# Patient Record
Sex: Female | Born: 1990 | Race: Black or African American | Hispanic: No | Marital: Single | Smoking: Current every day smoker
Health system: Southern US, Community
[De-identification: ages and names within clinical notes are randomized; demographics above are authoritative.]

## PROBLEM LIST (undated history)

## (undated) DIAGNOSIS — G8929 Other chronic pain: Secondary | ICD-10-CM

## (undated) DIAGNOSIS — M419 Scoliosis, unspecified: Secondary | ICD-10-CM

## (undated) DIAGNOSIS — K219 Gastro-esophageal reflux disease without esophagitis: Secondary | ICD-10-CM

---
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
## 2010-03-29 LAB — COMPREHENSIVE METABOLIC PANEL
Albumin: 2.9 g/dL — ABNORMAL LOW (ref 3.5–5.2)
Alkaline Phosphatase: 237 U/L — ABNORMAL HIGH (ref 39–117)
BUN: 4 mg/dL — ABNORMAL LOW (ref 6–23)
Calcium: 9.2 mg/dL (ref 8.4–10.5)
Glucose, Bld: 69 mg/dL — ABNORMAL LOW (ref 70–99)
Potassium: 3.6 mEq/L (ref 3.5–5.1)
Sodium: 135 mEq/L (ref 135–145)
Total Protein: 6.3 g/dL (ref 6.0–8.3)

## 2010-03-29 LAB — CBC
HCT: 30.8 % — ABNORMAL LOW (ref 36.0–46.0)
HCT: 30.8 % — ABNORMAL LOW (ref 36.0–46.0)
Hemoglobin: 6.5 g/dL — CL (ref 12.0–15.0)
Hemoglobin: 9.9 g/dL — ABNORMAL LOW (ref 12.0–15.0)
Hemoglobin: 9.9 g/dL — ABNORMAL LOW (ref 12.0–15.0)
MCHC: 32.2 g/dL (ref 30.0–36.0)
MCV: 82.7 fL (ref 78.0–100.0)
Platelets: 137 10*3/uL — ABNORMAL LOW (ref 150–400)
Platelets: 139 10*3/uL — ABNORMAL LOW (ref 150–400)
RBC: 2.36 MIL/uL — ABNORMAL LOW (ref 3.87–5.11)
RDW: 15.3 % (ref 11.5–15.5)
RDW: 15.5 % (ref 11.5–15.5)
WBC: 12.4 10*3/uL — ABNORMAL HIGH (ref 4.0–10.5)

## 2010-03-29 LAB — URINALYSIS, DIPSTICK ONLY
Bilirubin Urine: NEGATIVE
Ketones, ur: 15 mg/dL — AB
Leukocytes, UA: NEGATIVE
Nitrite: NEGATIVE
Protein, ur: 30 mg/dL — AB
pH: 6.5 (ref 5.0–8.0)

## 2010-03-29 LAB — PROTIME-INR
INR: 0.99 (ref 0.00–1.49)
Prothrombin Time: 13 seconds (ref 11.6–15.2)

## 2010-03-29 LAB — HEMOGLOBIN AND HEMATOCRIT, BLOOD
HCT: 22.1 % — ABNORMAL LOW (ref 36.0–46.0)
Hemoglobin: 7 g/dL — ABNORMAL LOW (ref 12.0–15.0)

## 2010-05-22 NOTE — Assessment & Plan Note (Signed)
Natalie, Mitchell                ACCOUNT NO.:  [id number]   MEDICAL RECORD NO.:  [id number]          PATIENT TYPE:  POB   LOCATION:  [facility] at [facility]         FACILITY:  [facility]   PHYSICIAN:  Catalina Antigua, MD     DATE OF BIRTH:  Nov 17, 1990   DATE OF SERVICE:  03/21/2009                                  CLINIC NOTE   This is a 20 year old, G1, P33, who is status post primary C-section on  February 11, 2009, secondary to [facility] at 36 weeks and failed vacuum  extraction.  The patient is currently doing well, is bottle feeding,  reports receiving ample help from her mother with the infant.  Denies  signs or symptoms of postpartum depression.  The patient is bottle  feeding, has not resumed her menses yet, and denies any abnormal  bleeding or discharge at this point.   PHYSICAL EXAMINATION:  VITAL SIGNS:  On physical exam, her blood  pressure is 104/67, pulse of 81, weight of 116 pounds.  BREAST:  Her breasts are symmetric in size, nontender, no expressible  nipple discharge.  No engorgement.  No palpable masses.  ABDOMEN:  Soft, nontender, nondistended.  Her incision has healed very  well.  On bimanual exam, she had normal vaginal mucosa, no abnormal bleeding or  discharge, normal-appearing cervix, small anteverted uterus.  No  palpable adnexal masses or tenderness.   ASSESSMENT AND PLAN:  She is an 20 year old G1, P1 status post primary C-  section on February 11, 2009, who is here for postop check.  The patient  is doing well, interested in Implanon for birth control.  Implanon  information packet was provided.  The patient will return in a week for  Implanon insertion.           ______________________________  Catalina Antigua, MD     PC/MEDQ  D:  03/21/2009  T:  03/22/2009  Job:  [id number]

## 2010-05-22 NOTE — Assessment & Plan Note (Signed)
NAMESEVIN, FARONE                ACCOUNT NO.:  [id number]   MEDICAL RECORD NO.:  [id number]          PATIENT TYPE:  POB   LOCATION:  [facility] at [facility]         FACILITY:  [facility]   PHYSICIAN:  Tinnie Gens, MD        DATE OF BIRTH:  06-Nov-1990   DATE OF SERVICE:  03/28/2009                                  CLINIC NOTE   The patient comes to office today for Implanon insertion.  The patient  is status post C-section on February 5th secondary to [facility] at 36 weeks.  She is doing very well following the birth of her son, he is doing very  well.  She has not resumed sexual intercourse.  She has not resumed her  menstrual cycle.  She does want the Implanon inserted.  She has read the  information and signed the consent.  Her UPT is negative.   LOT NUMBER:  [id number].   EXPIRATION:  November 2012.   PROCEDURE:  The patient's left arm was draped and cleaned with Betadine.  She was marked and injected with 2.5 mL of lidocaine.  The Implanon was  inserted without any difficulty.  The area was cleaned, dried, and  dressed.  The patient is informed that she should use condoms for 1  month.  She will report any signs of infection.  The patient and  practitioner were able to palpate the rod.  The patient will return for  her yearly exam or as needed.      Remonia Richter, NP    ______________________________  Tinnie Gens, MD    LR/MEDQ  D:  03/28/2009  T:  03/29/2009  Job:  [id number]

## 2012-04-16 ENCOUNTER — Emergency Department (HOSPITAL_BASED_OUTPATIENT_CLINIC_OR_DEPARTMENT_OTHER): Payer: Medicaid Other

## 2012-04-16 ENCOUNTER — Emergency Department (HOSPITAL_BASED_OUTPATIENT_CLINIC_OR_DEPARTMENT_OTHER)
Admission: EM | Admit: 2012-04-16 | Discharge: 2012-04-16 | Disposition: A | Discharge status: Home / Self Care | Payer: Medicaid Other | Attending: Emergency Medicine | Admitting: Emergency Medicine

## 2012-04-16 ENCOUNTER — Encounter (HOSPITAL_BASED_OUTPATIENT_CLINIC_OR_DEPARTMENT_OTHER): Payer: Self-pay | Admitting: *Deleted

## 2012-04-16 DIAGNOSIS — F17200 Nicotine dependence, unspecified, uncomplicated: Secondary | ICD-10-CM | POA: Insufficient documentation

## 2012-04-16 DIAGNOSIS — R059 Cough, unspecified: Secondary | ICD-10-CM | POA: Insufficient documentation

## 2012-04-16 DIAGNOSIS — Z3202 Encounter for pregnancy test, result negative: Secondary | ICD-10-CM | POA: Insufficient documentation

## 2012-04-16 DIAGNOSIS — R05 Cough: Secondary | ICD-10-CM | POA: Insufficient documentation

## 2012-04-16 DIAGNOSIS — J3489 Other specified disorders of nose and nasal sinuses: Secondary | ICD-10-CM | POA: Insufficient documentation

## 2012-04-16 DIAGNOSIS — B349 Viral infection, unspecified: Secondary | ICD-10-CM

## 2012-04-16 DIAGNOSIS — B9789 Other viral agents as the cause of diseases classified elsewhere: Secondary | ICD-10-CM | POA: Insufficient documentation

## 2012-04-16 LAB — URINE MICROSCOPIC-ADD ON

## 2012-04-16 LAB — URINALYSIS, ROUTINE W REFLEX MICROSCOPIC
Bilirubin Urine: NEGATIVE
Glucose, UA: NEGATIVE mg/dL
Hgb urine dipstick: NEGATIVE
Ketones, ur: NEGATIVE mg/dL
Protein, ur: NEGATIVE mg/dL
Specific Gravity, Urine: 1.019 (ref 1.005–1.030)
pH: 7 (ref 5.0–8.0)

## 2012-04-16 MED ORDER — BENZONATATE 100 MG PO CAPS: 100.0000 mg | ORAL_CAPSULE | Freq: Three times a day (TID) | ORAL | Status: DC

## 2012-04-16 MED ORDER — ONDANSETRON 4 MG PO TBDP
4.0000 mg | ORAL_TABLET | Freq: Once | ORAL | Status: AC
  Administered 2012-04-16: 4 mg via ORAL
  Filled 2012-04-16: qty 1

## 2012-04-16 MED ORDER — ONDANSETRON HCL 4 MG PO TABS: 4.0000 mg | ORAL_TABLET | Freq: Three times a day (TID) | ORAL | Status: DC | PRN

## 2012-04-16 NOTE — ED Provider Notes (Signed)
History     CSN: [id number]  Arrival date & time 04/16/12  1557   First MD Initiated Contact with Patient 04/16/12 1705      Chief Complaint  Patient presents with  . Emesis    (Consider location/radiation/quality/duration/timing/severity/associated sxs/prior treatment) Patient is a 22 y.o. female presenting with vomiting.  Emesis  Pt reports she has been feeling bad since yesterday with runny nose, dry cough, persistent vomiting and chest soreness. Chest pain is worse with cough. Denies fever Mother recently diagnosed with Bronchitis. She reports last emesis was this AM. Has had sips of juice since then.   History reviewed. No pertinent past medical history.  Past Surgical History  Procedure Laterality Date  . Cesarean section      No family history on file.  History  Substance Use Topics  . Smoking status: Current Every Day Smoker -- 0.50 packs/day    Types: Cigarettes  . Smokeless tobacco: Not on file  . Alcohol Use: Yes    OB History   Grav Para Term Preterm Abortions TAB SAB Ect Mult Living                  Review of Systems  Gastrointestinal: Positive for vomiting.   All other systems reviewed and are negative except as noted in HPI.   Allergies  Review of patient's allergies indicates no known allergies.  Home Medications  No current outpatient prescriptions on file.  BP 126/74  Pulse 88  Temp(Src) 99.1 F (37.3 C) (Oral)  Resp 16  SpO2 100%  Physical Exam  Nursing note and vitals reviewed. Constitutional: She is oriented to person, place, and time. She appears well-developed and well-nourished.  HENT:  Head: Normocephalic and atraumatic.  Eyes: EOM are normal. Pupils are equal, round, and reactive to light.  Neck: Normal range of motion. Neck supple.  Cardiovascular: Normal rate, normal heart sounds and intact distal pulses.   Pulmonary/Chest: Effort normal and breath sounds normal.  Abdominal: Bowel sounds are normal. She exhibits no  distension. There is no tenderness.  Musculoskeletal: Normal range of motion. She exhibits no edema and no tenderness.  Neurological: She is alert and oriented to person, place, and time. She has normal strength. No cranial nerve deficit or sensory deficit.  Skin: Skin is warm and dry. No rash noted.  Psychiatric: She has a normal mood and affect.    ED Course  Procedures (including critical care time)  Labs Reviewed  URINALYSIS, ROUTINE W REFLEX MICROSCOPIC - Abnormal; Notable for the following:    APPearance CLOUDY (*)    Leukocytes, UA MODERATE (*)    All other components within normal limits  URINE MICROSCOPIC-ADD ON - Abnormal; Notable for the following:    Squamous Epithelial / LPF FEW (*)    All other components within normal limits  PREGNANCY, URINE   Dg Chest 2 View  04/16/2012  *RADIOLOGY REPORT*  Clinical Data: Vomiting, cough  CHEST - 2 VIEW  Comparison: None.  Findings: Lungs are clear.  No pleural effusion or pneumothorax.  Cardiomediastinal silhouette is within normal limits.  Visualized osseous structures are within normal limits.  IMPRESSION: Normal chest radiographs.   Original Report Authenticated By: Charline Bills, M.D.      1. Viral syndrome       MDM  UA reviewed. No urinary or vaginal symptoms. Suspect this is a spurious result. CXR clear. Most likely viral URI. Given Zofran and able to tolerate PO in the ED.  Charles B. Bernette Mayers, MD 04/16/12 4098

## 2012-04-16 NOTE — ED Notes (Signed)
Vomiting and cough since yesterday. Fever and chills. Abdominal pain, chest pain with the cough and headache.

## 2012-04-16 NOTE — ED Notes (Signed)
PO fluids provided. 

## 2012-06-23 ENCOUNTER — Emergency Department (HOSPITAL_BASED_OUTPATIENT_CLINIC_OR_DEPARTMENT_OTHER): Payer: Medicaid Other

## 2012-06-23 ENCOUNTER — Emergency Department (HOSPITAL_BASED_OUTPATIENT_CLINIC_OR_DEPARTMENT_OTHER)
Admission: EM | Admit: 2012-06-23 | Discharge: 2012-06-23 | Disposition: A | Discharge status: Home / Self Care | Payer: Medicaid Other | Attending: Emergency Medicine | Admitting: Emergency Medicine

## 2012-06-23 ENCOUNTER — Encounter (HOSPITAL_BASED_OUTPATIENT_CLINIC_OR_DEPARTMENT_OTHER): Payer: Self-pay | Admitting: *Deleted

## 2012-06-23 DIAGNOSIS — Z79899 Other long term (current) drug therapy: Secondary | ICD-10-CM | POA: Insufficient documentation

## 2012-06-23 DIAGNOSIS — F17200 Nicotine dependence, unspecified, uncomplicated: Secondary | ICD-10-CM | POA: Insufficient documentation

## 2012-06-23 DIAGNOSIS — M4120 Other idiopathic scoliosis, site unspecified: Secondary | ICD-10-CM | POA: Insufficient documentation

## 2012-06-23 DIAGNOSIS — R209 Unspecified disturbances of skin sensation: Secondary | ICD-10-CM | POA: Insufficient documentation

## 2012-06-23 DIAGNOSIS — M419 Scoliosis, unspecified: Secondary | ICD-10-CM

## 2012-06-23 HISTORY — DX: Scoliosis, unspecified: M41.9

## 2012-06-23 MED ORDER — HYDROCODONE-ACETAMINOPHEN 5-325 MG PO TABS: 1.0000 | ORAL_TABLET | Freq: Four times a day (QID) | ORAL | Status: DC | PRN

## 2012-06-23 NOTE — ED Notes (Signed)
Low to mid back pain states she has scolosis but has been hurting worse this week denies any urinary symptoms.

## 2012-06-23 NOTE — ED Notes (Signed)
MD at bedside. 

## 2012-06-23 NOTE — ED Provider Notes (Signed)
History     CSN: [id number]  Arrival date & time 06/23/12  1022   First MD Initiated Contact with Patient 06/23/12 1035      Chief Complaint  Patient presents with  . Back Pain    (Consider location/radiation/quality/duration/timing/severity/associated sxs/prior treatment) Patient is a 22 y.o. female presenting with back pain. The history is provided by the patient.  Back Pain Location:  Thoracic spine and lumbar spine Quality:  Aching and stiffness Stiffness is present:  All day Radiates to:  Does not radiate Pain severity:  Moderate Pain is:  Worse during the night Onset quality:  Gradual Timing:  Constant Progression:  Waxing and waning Chronicity:  Chronic Context comment:  States has had back pain for years but tolerable until recently Relieved by:  Nothing Worsened by:  Movement and twisting Ineffective treatments:  Cold packs, heating pad, being still and ibuprofen Associated symptoms: numbness and tingling   Associated symptoms: no abdominal pain, no fever, no leg pain, no paresthesias and no weakness   Associated symptoms comment:  Occasionally with have numbness and tingling in the fingers intermittently Risk factors: no lack of exercise and no recent surgery     Past Medical History  Diagnosis Date  . Scoliosis     Past Surgical History  Procedure Laterality Date  . Cesarean section      History reviewed. No pertinent family history.  History  Substance Use Topics  . Smoking status: Current Every Day Smoker -- 0.50 packs/day    Types: Cigarettes  . Smokeless tobacco: Not on file  . Alcohol Use: Yes    OB History   Grav Para Term Preterm Abortions TAB SAB Ect Mult Living                  Review of Systems  Constitutional: Negative for fever.  Gastrointestinal: Negative for abdominal pain.  Musculoskeletal: Positive for back pain.  Neurological: Positive for tingling and numbness. Negative for weakness and paresthesias.  All other systems  reviewed and are negative.    Allergies  Review of patient's allergies indicates no known allergies.  Home Medications   Current Outpatient Rx  Name  Route  Sig  Dispense  Refill  . benzonatate (TESSALON) 100 MG capsule   Oral   Take 1 capsule (100 mg total) by mouth every 8 (eight) hours.   21 capsule   0   . HYDROcodone-acetaminophen (NORCO/VICODIN) 5-325 MG per tablet   Oral   Take 1 tablet by mouth every 6 (six) hours as needed for pain.   30 tablet   0   . ondansetron (ZOFRAN) 4 MG tablet   Oral   Take 1 tablet (4 mg total) by mouth every 8 (eight) hours as needed for nausea.   12 tablet   0     BP 131/92  Pulse 72  Temp(Src) 98.3 F (36.8 C) (Oral)  Resp 20  Ht 5\' 2"  (1.575 m)  Wt 110 lb (49.896 kg)  BMI 20.11 kg/m2  SpO2 100%  Physical Exam  Nursing note and vitals reviewed. Constitutional: She is oriented to person, place, and time. She appears well-developed and well-nourished. No distress.  HENT:  Head: Normocephalic and atraumatic.  Mouth/Throat: Oropharynx is clear and moist.  Eyes: Conjunctivae and EOM are normal. Pupils are equal, round, and reactive to light.  Neck: Normal range of motion. Neck supple.  Cardiovascular: Normal rate, regular rhythm and intact distal pulses.   No murmur heard. Pulmonary/Chest: Effort normal and breath  sounds normal. No respiratory distress. She has no wheezes. She has no rales.  Abdominal: Soft. She exhibits no distension. There is no tenderness. There is no rebound and no guarding.  Musculoskeletal: Normal range of motion. She exhibits no edema and no tenderness.       Thoracic back: She exhibits tenderness and bony tenderness.       Lumbar back: She exhibits tenderness and bony tenderness. She exhibits no spasm and normal pulse.  Moderate scoliosis through the thoracic and lumbar spine  Neurological: She is alert and oriented to person, place, and time. She has normal strength. No sensory deficit.  Skin: Skin is  warm and dry. No rash noted. No erythema.  Psychiatric: She has a normal mood and affect. Her behavior is normal.    ED Course  Procedures (including critical care time)  Labs Reviewed - No data to display Dg Thoracic Spine W/swimmers  06/23/2012   *RADIOLOGY REPORT*  Clinical Data: Pain  THORACIC SPINE - 2 VIEW + SWIMMERS  Comparison: None.  Findings:  Frontal, lateral, and swimmer's views were obtained. There is mid thoracic levoscoliosis.  There is no fracture or spondylolisthesis.  Disc spaces appear intact.  IMPRESSION: Scoliosis.  No fracture or appreciable arthropathy.   Original Report Authenticated By: Bretta Bang, M.D.   Dg Lumbar Spine Complete  06/23/2012   *RADIOLOGY REPORT*  Clinical Data: Pain  LUMBAR SPINE - COMPLETE 4+ VIEW  Comparison: None.  Findings: Frontal, lateral, spot lumbosacral lateral, and bilateral oblique views were obtained.  There are five non-rib bearing lumbar type vertebral bodies.  There is lumbar dextrorotoscoliosis.  There is no fracture or spondylolisthesis.  There is mild disc space narrowing at L4-5. Other disc spaces appear intact.  There is no appreciable facet arthropathy.  No erosive change.  IMPRESSION: Scoliosis with rotatory component.  Mild disc space narrowing L4-5.  No fracture or spondylolisthesis.  No erosive change.   Original Report Authenticated By: Bretta Bang, M.D.     1. Scoliosis       MDM   Patient here complaining of diffuse back pain without neurologic findings. On exam patient has significant scoliosis. Plain films corroborate scoliosis without any acute findings. Discussed this with the patient and gave her followup and pain control.        Gwyneth Sprout, MD 06/23/12 1539

## 2012-06-23 NOTE — ED Notes (Signed)
Patient transported to X-ray 

## 2014-02-14 ENCOUNTER — Encounter (HOSPITAL_BASED_OUTPATIENT_CLINIC_OR_DEPARTMENT_OTHER): Payer: Self-pay | Admitting: *Deleted

## 2014-02-14 ENCOUNTER — Emergency Department (HOSPITAL_BASED_OUTPATIENT_CLINIC_OR_DEPARTMENT_OTHER)
Admission: EM | Admit: 2014-02-14 | Discharge: 2014-02-14 | Disposition: A | Discharge status: Home / Self Care | Payer: Medicaid Other | Attending: Emergency Medicine | Admitting: Emergency Medicine

## 2014-02-14 DIAGNOSIS — Z79899 Other long term (current) drug therapy: Secondary | ICD-10-CM | POA: Insufficient documentation

## 2014-02-14 DIAGNOSIS — Z720 Tobacco use: Secondary | ICD-10-CM | POA: Insufficient documentation

## 2014-02-14 DIAGNOSIS — M545 Low back pain: Secondary | ICD-10-CM | POA: Diagnosis not present

## 2014-02-14 DIAGNOSIS — M549 Dorsalgia, unspecified: Secondary | ICD-10-CM

## 2014-02-14 DIAGNOSIS — G8929 Other chronic pain: Secondary | ICD-10-CM | POA: Insufficient documentation

## 2014-02-14 MED ORDER — CYCLOBENZAPRINE HCL 10 MG PO TABS: 10.0000 mg | ORAL_TABLET | Freq: Two times a day (BID) | ORAL | Status: DC | PRN

## 2014-02-14 MED ORDER — LIDOCAINE 5 % EX PTCH: 1.0000 | MEDICATED_PATCH | CUTANEOUS | Status: DC

## 2014-02-14 MED ORDER — HYDROCODONE-ACETAMINOPHEN 5-325 MG PO TABS: 1.0000 | ORAL_TABLET | Freq: Four times a day (QID) | ORAL | Status: DC | PRN

## 2014-02-14 NOTE — Progress Notes (Signed)
ED CM [facility] Patient recently received a prescription from PCP for same medication. Prescription cannot be filled.

## 2014-02-14 NOTE — ED Notes (Signed)
D/c home with rx x 3 for flexeril, hydrocodone, and lidoderm patches

## 2014-02-14 NOTE — ED Provider Notes (Signed)
CSN: [id number]     Arrival date & time 02/14/14  1825 History  This chart was scribed for Natalie MochaBlair Kaiulani Sitton, MD by Abel PrestoKara Demonbreun, ED Scribe. This patient was seen in room MH01/MH01 and the patient's care was started at 6:52 PM.    Chief Complaint  Patient presents with  . Back Pain     Patient is a 24 y.o. female presenting with back pain. The history is provided by the patient. No language interpreter was used.  Back Pain Location:  Lumbar spine Quality: throbbing. Radiates to:  Does not radiate Pain severity:  Moderate Pain is:  Worse during the night Onset quality:  Sudden Duration:  2 weeks Timing:  Constant Progression:  Unchanged Chronicity:  New Context: not recent injury   Relieved by:  OTC medications Associated symptoms: no numbness    HPI Comments: Natalie Mitchell is a 24 y.o. female with PMHx of scoliosis who presents to the Emergency Department complaining of throbbing lower back with onset 2 weeks ago. 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 She states pain keeps her up at night. Pt has taken Tylenol for relief. She notes pain is new for her. Pt denies any recent injury and any numbness. Pt currently does not have a PCP.   Past Medical History  Diagnosis Date  . Scoliosis    Past Surgical History  Procedure Laterality Date  . Cesarean section     History reviewed. No pertinent family history. History  Substance Use Topics  . Smoking status: Current Every Day Smoker -- 0.50 packs/day    Types: Cigarettes  . Smokeless tobacco: Not on file  . Alcohol Use: Yes   OB History    No data available     Review of Systems  Musculoskeletal: Positive for back pain.  Neurological: Negative for numbness.  All other systems reviewed and are negative.     Allergies  Review of patient's allergies indicates no known allergies.  Home Medications   Prior to Admission medications   Medication Sig Start Date End Date Taking? Authorizing  Provider  benzonatate (TESSALON) 100 MG capsule Take 1 capsule (100 mg total) by mouth every 8 (eight) hours. 04/16/12   Charles B. Bernette MayersSheldon, MD  HYDROcodone-acetaminophen (NORCO/VICODIN) 5-325 MG per tablet Take 1 tablet by mouth every 6 (six) hours as needed for pain. 06/23/12   Gwyneth SproutWhitney Plunkett, MD  ondansetron (ZOFRAN) 4 MG tablet Take 1 tablet (4 mg total) by mouth every 8 (eight) hours as needed for nausea. 04/16/12   Charles B. Bernette MayersSheldon, MD   BP 146/98 mmHg  Pulse 84  Temp(Src) 98.4 F (36.9 C)  Resp 18  Ht 5\' 4"  (1.626 m)  Wt 106 lb (48.081 kg)  BMI 18.19 kg/m2  SpO2 99% Physical Exam  Constitutional: She is oriented to person, place, and time. She appears well-developed and well-nourished. No distress.  HENT:  Head: Normocephalic and atraumatic.  Eyes: EOM are normal.  Neck: Normal range of motion.  Cardiovascular: Normal rate, regular rhythm and normal heart sounds.   Pulmonary/Chest: Effort normal and breath sounds normal.  Abdominal: Soft. She exhibits no distension. There is no tenderness.  Musculoskeletal: Normal range of motion.       Lumbar back: She exhibits normal range of motion, no tenderness and no bony tenderness.  Neurological: She is alert and oriented to person, place, and time.  Skin: Skin is warm and dry.  Psychiatric: She has a normal mood and affect. Judgment  normal.  Nursing note and vitals reviewed.   ED Course  Procedures (including critical care time) DIAGNOSTIC STUDIES: Oxygen Saturation is 99% on room air, normal by my interpretation.    COORDINATION OF CARE: 6:54 PM Discussed treatment plan with patient at beside, the patient agrees with the plan and has no further questions at this time.   Labs Review Labs Reviewed - No data to display  Imaging Review No results found.   EKG Interpretation None      MDM   Final diagnoses:  None    10F here with back pain, present for 2 weeks. Hx of scoliosis. No trauma. Worse with being on her  fee t all day. No systemic symptoms. Exam benign. No palpable tenderness. Given small amount of pain medicine, lidoderm patches.    I personally performed the services described in this documentation, which was scribed in my presence. The recorded information has been reviewed and is accurate.      Natalie Mocha, MD 02/14/14 [id number]

## 2014-02-14 NOTE — Discharge Instructions (Signed)

## 2014-02-14 NOTE — ED Notes (Signed)
Pt c/o mid-lower back pain x 2 weeks

## 2014-02-14 NOTE — ED Notes (Signed)
MD at bedside. 

## 2014-05-31 ENCOUNTER — Emergency Department (HOSPITAL_BASED_OUTPATIENT_CLINIC_OR_DEPARTMENT_OTHER)
Admission: EM | Admit: 2014-05-31 | Discharge: 2014-05-31 | Disposition: A | Discharge status: Home / Self Care | Payer: Medicaid Other | Attending: Emergency Medicine | Admitting: Emergency Medicine

## 2014-05-31 ENCOUNTER — Encounter (HOSPITAL_BASED_OUTPATIENT_CLINIC_OR_DEPARTMENT_OTHER): Payer: Self-pay | Admitting: *Deleted

## 2014-05-31 DIAGNOSIS — Z8739 Personal history of other diseases of the musculoskeletal system and connective tissue: Secondary | ICD-10-CM | POA: Insufficient documentation

## 2014-05-31 DIAGNOSIS — Z720 Tobacco use: Secondary | ICD-10-CM | POA: Insufficient documentation

## 2014-05-31 DIAGNOSIS — Z79899 Other long term (current) drug therapy: Secondary | ICD-10-CM | POA: Insufficient documentation

## 2014-05-31 DIAGNOSIS — J020 Streptococcal pharyngitis: Secondary | ICD-10-CM | POA: Insufficient documentation

## 2014-05-31 MED ORDER — CEPHALEXIN 500 MG PO CAPS: 500.0000 mg | ORAL_CAPSULE | Freq: Three times a day (TID) | ORAL | Status: DC

## 2014-05-31 NOTE — ED Provider Notes (Signed)
CSN: [id number]     Arrival date & time 05/31/14  1024 History   First MD Initiated Contact with Patient 05/31/14 1048     Chief Complaint  Patient presents with  . Emesis     (Consider location/radiation/quality/duration/timing/severity/associated sxs/prior Treatment) HPI Comments: Patient is a 24 year old female who presents with a 2 day history of fever, sore throat, vomiting. Her son was diagnosed this morning with strep.  Patient is a 24 y.o. female presenting with vomiting. The history is provided by the patient.  Emesis Severity:  Moderate Duration:  2 days Timing:  Constant Progression:  Worsening Chronicity:  New Recent urination:  Normal Relieved by:  Nothing Worsened by:  Nothing tried Associated symptoms: chills and fever   Associated symptoms: no abdominal pain     Past Medical History  Diagnosis Date  . Scoliosis    Past Surgical History  Procedure Laterality Date  . Cesarean section     No family history on file. History  Substance Use Topics  . Smoking status: Current Every Day Smoker -- 0.50 packs/day    Types: Cigarettes  . Smokeless tobacco: Not on file  . Alcohol Use: Yes   OB History    No data available     Review of Systems  Constitutional: Positive for chills.  Gastrointestinal: Positive for vomiting. Negative for abdominal pain.  All other systems reviewed and are negative.     Allergies  Review of patient's allergies indicates no known allergies.  Home Medications   Prior to Admission medications   Medication Sig Start Date End Date Taking? Authorizing Provider  benzonatate (TESSALON) 100 MG capsule Take 1 capsule (100 mg total) by mouth every 8 (eight) hours. 04/16/12   Susy Frizzleharles Sheldon, MD  cyclobenzaprine (FLEXERIL) 10 MG tablet Take 1 tablet (10 mg total) by mouth 2 (two) times daily as needed for muscle spasms. 02/14/14   Elwin MochaBlair Walden, MD  HYDROcodone-acetaminophen (NORCO/VICODIN) 5-325 MG per tablet Take 1 tablet by mouth  every 6 (six) hours as needed for pain. 06/23/12   Gwyneth SproutWhitney Plunkett, MD  HYDROcodone-acetaminophen (NORCO/VICODIN) 5-325 MG per tablet Take 1 tablet by mouth every 6 (six) hours as needed for moderate pain. 02/14/14   Elwin MochaBlair Walden, MD  lidocaine (LIDODERM) 5 % Place 1 patch onto the skin daily. Remove & Discard patch within 12 hours or as directed by MD 02/14/14   Elwin MochaBlair Walden, MD  ondansetron (ZOFRAN) 4 MG tablet Take 1 tablet (4 mg total) by mouth every 8 (eight) hours as needed for nausea. 04/16/12   Susy Frizzleharles Sheldon, MD   BP 142/90 mmHg  Pulse 90  Temp(Src) 98.4 F (36.9 C) (Oral)  Resp 20  Ht 5\' 4"  (1.626 m)  Wt 110 lb (49.896 kg)  BMI 18.87 kg/m2  SpO2 100% Physical Exam  Constitutional: She is oriented to person, place, and time. She appears well-developed and well-nourished. No distress.  HENT:  Head: Normocephalic and atraumatic.  Mouth/Throat: No oropharyngeal exudate.  Posterior oropharynx is erythematous without exudates.  Neck: Normal range of motion. Neck supple.  Cardiovascular: Normal rate and regular rhythm.  Exam reveals no gallop and no friction rub.   No murmur heard. Pulmonary/Chest: Effort normal and breath sounds normal. No respiratory distress. She has no wheezes.  Abdominal: Soft. Bowel sounds are normal. She exhibits no distension. There is no tenderness.  Musculoskeletal: Normal range of motion.  Lymphadenopathy:    She has no cervical adenopathy.  Neurological: She is alert and oriented to person, place, and time.  Skin: Skin is warm and dry. She is not diaphoretic.  Nursing note and vitals reviewed.   ED Course  Procedures (including critical care time) Labs Review Labs Reviewed - No data to display  Imaging Review No results found.   EKG Interpretation None      MDM   Final diagnoses:  None    Due to the close contact with strep throat with similar symptoms, she will be treated with Keflex for presumed strep. To return as needed for any  problems.    Geoffery Lyons, MD 05/31/14 [phone]

## 2014-05-31 NOTE — Discharge Instructions (Signed)
Keflex as prescribed.  You Profen 600 mg every 6 hours as needed for pain or fever.   Strep Throat Strep throat is an infection of the throat caused by a bacteria named Streptococcus pyogenes. Your health care provider may call the infection streptococcal "tonsillitis" or "pharyngitis" depending on whether there are signs of inflammation in the tonsils or back of the throat. Strep throat is most common in children aged 24-15 years during the cold months of the year, but it can occur in people of any age during any season. This infection is spread from person to person (contagious) through coughing, sneezing, or other close contact. SIGNS AND SYMPTOMS   Fever or chills.  Painful, swollen, red tonsils or throat.  Pain or difficulty when swallowing.  White or yellow spots on the tonsils or throat.  Swollen, tender lymph nodes or "glands" of the neck or under the jaw.  Red rash all over the body (rare). DIAGNOSIS  Many different infections can cause the same symptoms. A test must be done to confirm the diagnosis so the right treatment can be given. A "rapid strep test" can help your health care provider make the diagnosis in a few minutes. If this test is not available, a light swab of the infected area can be used for a throat culture test. If a throat culture test is done, results are usually available in a day or two. TREATMENT  Strep throat is treated with antibiotic medicine. HOME CARE INSTRUCTIONS   Gargle with 1 tsp of salt in 1 cup of warm water, 3-4 times per day or as needed for comfort.  Family members who also have a sore throat or fever should be tested for strep throat and treated with antibiotics if they have the strep infection.  Make sure everyone in your household washes their hands well.  Do not share food, drinking cups, or personal items that could cause the infection to spread to others.  You may need to eat a soft food diet until your sore throat gets  better.  Drink enough water and fluids to keep your urine clear or pale yellow. This will help prevent dehydration.  Get plenty of rest.  Stay home from school, day care, or work until you have been on antibiotics for 24 hours.  Take medicines only as directed by your health care provider.  Take your antibiotic medicine as directed by your health care provider. Finish it even if you start to feel better. SEEK MEDICAL CARE IF:   The glands in your neck continue to enlarge.  You develop a rash, cough, or earache.  You cough up green, yellow-brown, or bloody sputum.  You have pain or discomfort not controlled by medicines.  Your problems seem to be getting worse rather than better.  You have a fever. SEEK IMMEDIATE MEDICAL CARE IF:   You develop any new symptoms such as vomiting, severe headache, stiff or painful neck, chest pain, shortness of breath, or trouble swallowing.  You develop severe throat pain, drooling, or changes in your voice.  You develop swelling of the neck, or the skin on the neck becomes red and tender.  You develop signs of dehydration, such as fatigue, dry mouth, and decreased urination.  You become increasingly sleepy, or you cannot wake up completely. MAKE SURE YOU:  Understand these instructions.  Will watch your condition.  Will get help right away if you are not doing well or get worse. Document Released: 12/22/1999 Document Revised: 05/10/2013 Document Reviewed:  02/22/2010 Exit[employer] Patient Information 2015 [place], [place]. This information is not intended to replace advice given to you by your health care provider. Make sure you discuss any questions you have with your health care provider.

## 2014-05-31 NOTE — ED Notes (Signed)
Patient states she has a two day history of a headache and sore throat, which have been associated with chills, nausea and vomiting.  States her son was diagnosed with strep throat today.

## 2014-10-06 ENCOUNTER — Emergency Department (HOSPITAL_BASED_OUTPATIENT_CLINIC_OR_DEPARTMENT_OTHER)
Admission: EM | Admit: 2014-10-06 | Discharge: 2014-10-06 | Disposition: A | Discharge status: Home / Self Care | Payer: Medicaid Other | Attending: Emergency Medicine | Admitting: Emergency Medicine

## 2014-10-06 ENCOUNTER — Encounter (HOSPITAL_BASED_OUTPATIENT_CLINIC_OR_DEPARTMENT_OTHER): Payer: Self-pay | Admitting: Emergency Medicine

## 2014-10-06 ENCOUNTER — Emergency Department (HOSPITAL_BASED_OUTPATIENT_CLINIC_OR_DEPARTMENT_OTHER): Payer: Medicaid Other

## 2014-10-06 DIAGNOSIS — Z792 Long term (current) use of antibiotics: Secondary | ICD-10-CM | POA: Insufficient documentation

## 2014-10-06 DIAGNOSIS — G8929 Other chronic pain: Secondary | ICD-10-CM

## 2014-10-06 DIAGNOSIS — M79674 Pain in right toe(s): Secondary | ICD-10-CM | POA: Insufficient documentation

## 2014-10-06 DIAGNOSIS — Z79899 Other long term (current) drug therapy: Secondary | ICD-10-CM | POA: Insufficient documentation

## 2014-10-06 DIAGNOSIS — Z720 Tobacco use: Secondary | ICD-10-CM | POA: Insufficient documentation

## 2014-10-06 DIAGNOSIS — M419 Scoliosis, unspecified: Secondary | ICD-10-CM | POA: Insufficient documentation

## 2014-10-06 MED ORDER — NAPROXEN 500 MG PO TABS: 500.0000 mg | ORAL_TABLET | Freq: Two times a day (BID) | ORAL | Status: DC

## 2014-10-06 NOTE — ED Notes (Signed)
Family at bedside. 

## 2014-10-06 NOTE — Discharge Instructions (Signed)
Would recommend soaking the the right foot in warm water for 20 minutes and then trimming back the second toenail. Take the Naprosyn on a regular basis. Make an appointment to follow-up with podiatry if it does not improve. Work note provided.

## 2014-10-06 NOTE — ED Provider Notes (Signed)
CSN: [id number]     Arrival date & time 10/06/14  0930 History   First MD Initiated Contact with Patient 10/06/14 1000     Chief Complaint  Patient presents with  . Toe Pain     (Consider location/radiation/quality/duration/timing/severity/associated sxs/prior Treatment) Patient is a 24 y.o. female presenting with toe pain. The history is provided by the patient.  Toe Pain Pertinent negatives include no chest pain, no abdominal pain, no headaches and no shortness of breath.   patient with complaint of right second toe pain for 3 months. Patient thinks she has an ingrown toenail. She has not trimmed that toenail because it's been painful. No direct injury to the foot.  Past Medical History  Diagnosis Date  . Scoliosis   . Scoliosis    Past Surgical History  Procedure Laterality Date  . Cesarean section     History reviewed. No pertinent family history. Social History  Substance Use Topics  . Smoking status: Current Every Day Smoker -- 0.50 packs/day    Types: Cigarettes  . Smokeless tobacco: None  . Alcohol Use: Yes   OB History    No data available     Review of Systems  Constitutional: Negative for fever.  HENT: Negative for congestion.   Eyes: Negative for redness.  Respiratory: Negative for shortness of breath.   Cardiovascular: Negative for chest pain.  Gastrointestinal: Negative for abdominal pain.  Genitourinary: Negative for dysuria.  Musculoskeletal: Negative for back pain.  Skin: Negative for rash and wound.  Neurological: Negative for weakness, numbness and headaches.  Hematological: Does not bruise/bleed easily.  Psychiatric/Behavioral: Negative for confusion.      Allergies  Review of patient's allergies indicates no known allergies.  Home Medications   Prior to Admission medications   Medication Sig Start Date End Date Taking? Authorizing Provider  benzonatate (TESSALON) 100 MG capsule Take 1 capsule (100 mg total) by mouth every 8 (eight)  hours. 04/16/12   Susy Frizzle, MD  cephALEXin (KEFLEX) 500 MG capsule Take 1 capsule (500 mg total) by mouth 3 (three) times daily. 05/31/14   Geoffery Lyons, MD  cyclobenzaprine (FLEXERIL) 10 MG tablet Take 1 tablet (10 mg total) by mouth 2 (two) times daily as needed for muscle spasms. 02/14/14   Elwin Mocha, MD  HYDROcodone-acetaminophen (NORCO/VICODIN) 5-325 MG per tablet Take 1 tablet by mouth every 6 (six) hours as needed for pain. 06/23/12   Gwyneth Sprout, MD  HYDROcodone-acetaminophen (NORCO/VICODIN) 5-325 MG per tablet Take 1 tablet by mouth every 6 (six) hours as needed for moderate pain. 02/14/14   Elwin Mocha, MD  lidocaine (LIDODERM) 5 % Place 1 patch onto the skin daily. Remove & Discard patch within 12 hours or as directed by MD 02/14/14   Elwin Mocha, MD  naproxen (NAPROSYN) 500 MG tablet Take 1 tablet (500 mg total) by mouth 2 (two) times daily. 10/06/14   Vanetta Mulders, MD  ondansetron (ZOFRAN) 4 MG tablet Take 1 tablet (4 mg total) by mouth every 8 (eight) hours as needed for nausea. 04/16/12   Susy Frizzle, MD   BP 154/94 mmHg  Pulse 60  Temp(Src) 97.7 F (36.5 C) (Oral)  Resp 18  Ht  (1.702 m)  Wt 108 lb (48.988 kg)  BMI 16.91 kg/m2  SpO2 100% Physical Exam  Constitutional: She is oriented to person, place, and time. She appears well-developed and well-nourished. No distress.  HENT:  Head: Normocephalic and atraumatic.  Eyes: Conjunctivae and EOM are normal. Pupils are equal, round,  and reactive to light.  Neck: Normal range of motion.  Cardiovascular: Normal rate, regular rhythm and normal heart sounds.   No murmur heard. Pulmonary/Chest: Effort normal and breath sounds normal. No respiratory distress.  Abdominal: Soft. Bowel sounds are normal. There is no tenderness.  Musculoskeletal: Normal range of motion.  Right foot second toe the nail is grown extensively and curling in over the tip of the soft tissue aspect of the second toe. No real erythema. The nail  itself is a bit thickened. No purulent discharge. Looking under the nail as it wraps around the skin appears to be normal.  Neurological: She is alert and oriented to person, place, and time. No cranial nerve deficit. She exhibits normal muscle tone. Coordination normal.  Skin: Skin is warm. No erythema.  Nursing note and vitals reviewed.   ED Course  Procedures (including critical care time) Labs Review Labs Reviewed - No data to display  Imaging Review Dg Toe 2nd Right  10/06/2014   CLINICAL DATA:  Right second toe pain for 3 months. No known injury. Initial encounter.  EXAM: RIGHT SECOND TOE  COMPARISON:  None.  FINDINGS: No acute bony or joint abnormality is identified. No radiopaque foreign body or soft tissue gas collection is seen. Small defect in the skin at the tip of the toe may be due to an ulceration.  IMPRESSION: Question skin ulceration the tip of the second toe. The examination is otherwise negative.   Electronically Signed   By: Drusilla Kanner M.D.   On: 10/06/2014 10:50   I have personally reviewed and evaluated these images and lab results as part of my medical decision-making.   EKG Interpretation None      MDM   Final diagnoses:  Toe pain, chronic, right    Patient with complaint of right second toe pain at the tip of the toe and nail area for 3 months. The particular nail has not been trimmed for a long period of time patient stated because of pain. Denies any purulent discharge at any point in time. Does not seem to clinically be consistent with an ingrown toenail at this time certainly not acutely inflamed. Suspect the pain now may be due to the nail pushing into the pulp of the second toe. Have recommended soaking and then trimming it back. And following up with podiatry and anti-inflammatory medicine. X-ray shows no bony injury.    Vanetta Mulders, MD 10/06/14 [phone]

## 2014-10-06 NOTE — ED Notes (Signed)
MD at bedside. 

## 2014-10-06 NOTE — ED Notes (Signed)
Patient transported to X-ray 

## 2014-10-06 NOTE — ED Notes (Addendum)
Pt with right toe pain, reports ingrown toe nail to second right toenail x 3 months

## 2014-12-08 ENCOUNTER — Emergency Department (HOSPITAL_BASED_OUTPATIENT_CLINIC_OR_DEPARTMENT_OTHER)
Admission: EM | Admit: 2014-12-08 | Discharge: 2014-12-08 | Disposition: A | Discharge status: Home / Self Care | Payer: Medicaid Other | Attending: Emergency Medicine | Admitting: Emergency Medicine

## 2014-12-08 ENCOUNTER — Encounter (HOSPITAL_BASED_OUTPATIENT_CLINIC_OR_DEPARTMENT_OTHER): Payer: Self-pay

## 2014-12-08 ENCOUNTER — Emergency Department (HOSPITAL_BASED_OUTPATIENT_CLINIC_OR_DEPARTMENT_OTHER): Payer: Medicaid Other

## 2014-12-08 DIAGNOSIS — Z792 Long term (current) use of antibiotics: Secondary | ICD-10-CM | POA: Insufficient documentation

## 2014-12-08 DIAGNOSIS — F17210 Nicotine dependence, cigarettes, uncomplicated: Secondary | ICD-10-CM | POA: Insufficient documentation

## 2014-12-08 DIAGNOSIS — J069 Acute upper respiratory infection, unspecified: Secondary | ICD-10-CM | POA: Insufficient documentation

## 2014-12-08 DIAGNOSIS — R112 Nausea with vomiting, unspecified: Secondary | ICD-10-CM | POA: Insufficient documentation

## 2014-12-08 DIAGNOSIS — Z8739 Personal history of other diseases of the musculoskeletal system and connective tissue: Secondary | ICD-10-CM | POA: Insufficient documentation

## 2014-12-08 DIAGNOSIS — Z791 Long term (current) use of non-steroidal anti-inflammatories (NSAID): Secondary | ICD-10-CM | POA: Insufficient documentation

## 2014-12-08 MED ORDER — PROMETHAZINE-DM 6.25-15 MG/5ML PO SYRP: 5.0000 mL | ORAL_SOLUTION | Freq: Four times a day (QID) | ORAL | Status: DC | PRN

## 2014-12-08 NOTE — ED Provider Notes (Addendum)
CSN: [id number]     Arrival date & time 12/08/14  1200 History   First MD Initiated Contact with Patient 12/08/14 1238     Chief Complaint  Patient presents with  . Chest Pain     (Consider location/radiation/quality/duration/timing/severity/associated sxs/prior Treatment) Patient is a 24 y.o. female presenting with chest pain and URI. The history is provided by the patient.  Chest Pain Pain location:  Substernal area Pain quality: aching   Pain radiates to:  Does not radiate Duration:  1 day Timing:  Constant Progression:  Worsening Context: breathing   Ineffective treatments:  None tried Associated symptoms: cough, nausea and vomiting   Associated symptoms: no abdominal pain, no fever and no shortness of breath   Associated symptoms comment:  Chills Risk factors: smoking   Risk factors: no coronary artery disease and no diabetes mellitus   URI Presenting symptoms: cough   Presenting symptoms: no fever   Severity:  Moderate Onset quality:  Gradual Duration:  1 day Timing:  Constant Progression:  Worsening Chronicity:  New Associated symptoms: no neck pain and no wheezing   Risk factors: sick contacts     Past Medical History  Diagnosis Date  . Scoliosis   . Scoliosis    Past Surgical History  Procedure Laterality Date  . Cesarean section     History reviewed. No pertinent family history. Social History  Substance Use Topics  . Smoking status: Current Every Day Smoker -- 0.50 packs/day    Types: Cigarettes  . Smokeless tobacco: None  . Alcohol Use: Yes   OB History    No data available     Review of Systems  Constitutional: Negative for fever.  Respiratory: Positive for cough. Negative for shortness of breath and wheezing.   Cardiovascular: Positive for chest pain.  Gastrointestinal: Positive for nausea and vomiting. Negative for abdominal pain.  Musculoskeletal: Negative for neck pain.  All other systems reviewed and are negative.     Allergies   Review of patient's allergies indicates no known allergies.  Home Medications   Prior to Admission medications   Medication Sig Start Date End Date Taking? Authorizing Provider  benzonatate (TESSALON) 100 MG capsule Take 1 capsule (100 mg total) by mouth every 8 (eight) hours. 04/16/12   Susy Frizzleharles Sheldon, MD  cephALEXin (KEFLEX) 500 MG capsule Take 1 capsule (500 mg total) by mouth 3 (three) times daily. 05/31/14   Geoffery Lyonsouglas Delo, MD  cyclobenzaprine (FLEXERIL) 10 MG tablet Take 1 tablet (10 mg total) by mouth 2 (two) times daily as needed for muscle spasms. 02/14/14   Elwin MochaBlair Walden, MD  HYDROcodone-acetaminophen (NORCO/VICODIN) 5-325 MG per tablet Take 1 tablet by mouth every 6 (six) hours as needed for pain. 06/23/12   Gwyneth SproutWhitney Priest Lockridge, MD  HYDROcodone-acetaminophen (NORCO/VICODIN) 5-325 MG per tablet Take 1 tablet by mouth every 6 (six) hours as needed for moderate pain. 02/14/14   Elwin MochaBlair Walden, MD  lidocaine (LIDODERM) 5 % Place 1 patch onto the skin daily. Remove & Discard patch within 12 hours or as directed by MD 02/14/14   Elwin MochaBlair Walden, MD  naproxen (NAPROSYN) 500 MG tablet Take 1 tablet (500 mg total) by mouth 2 (two) times daily. 10/06/14   Vanetta MuldersScott Zackowski, MD  ondansetron (ZOFRAN) 4 MG tablet Take 1 tablet (4 mg total) by mouth every 8 (eight) hours as needed for nausea. 04/16/12   Susy Frizzleharles Sheldon, MD   BP 131/108 mmHg  Pulse 93  Temp(Src) 98.7 F (37.1 C) (Oral)  Resp 15  Ht   (1.6 m)  Wt 110 lb (49.896 kg)  BMI 19.49 kg/m2  SpO2 99% Physical Exam  Constitutional: She is oriented to person, place, and time. She appears well-developed and well-nourished. No distress.  HENT:  Head: Normocephalic and atraumatic.  Right Ear: Tympanic membrane and ear canal normal.  Left Ear: Tympanic membrane and ear canal normal.  Nose: Mucosal edema present.  Mouth/Throat: Oropharynx is clear and moist. No oropharyngeal exudate, posterior oropharyngeal edema or posterior oropharyngeal erythema.   Eyes: Conjunctivae and EOM are normal. Pupils are equal, round, and reactive to light.  Neck: Normal range of motion. Neck supple.  Cardiovascular: Normal rate, regular rhythm and intact distal pulses.   No murmur heard. Pulmonary/Chest: Effort normal and breath sounds normal. No respiratory distress. She has no wheezes. She has no rales.  Abdominal: Soft. She exhibits no distension. There is no tenderness. There is no rebound and no guarding.  Musculoskeletal: Normal range of motion. She exhibits no edema or tenderness.  Lymphadenopathy:    She has no cervical adenopathy.  Neurological: She is alert and oriented to person, place, and time.  Skin: Skin is warm and dry. No rash noted. No erythema.  Psychiatric: She has a normal mood and affect. Her behavior is normal.  Nursing note and vitals reviewed.   ED Course  Procedures (including critical care time) Labs Review Labs Reviewed - No data to display  Imaging Review No results found. I have personally reviewed and evaluated these images and lab results as part of my medical decision-making.   EKG Interpretation   Date/Time:  Thursday December 08 2014 12:25:55 EST Ventricular Rate:  74 PR Interval:  202 QRS Duration: 84 QT Interval:  374 QTC Calculation: 415 R Axis:   82 Text Interpretation:  Normal sinus rhythm Normal ECG No previous tracing  Confirmed by Anitra Lauth  MD, Alphonzo Lemmings ([id number]) on 12/08/2014 12:35:42 PM      MDM   Final diagnoses:  URI, acute    Pt with symptoms consistent with viral URI.  Well appearing here.  No signs of breathing difficulty  No signs of pharyngitis, otitis or abnormal abdominal findings.    pt to return with any further problems.     Gwyneth Sprout, MD 12/08/14 1251  Gwyneth Sprout, MD 12/08/14 1252

## 2014-12-08 NOTE — Discharge Instructions (Signed)
Upper Respiratory Infection, Adult Most upper respiratory infections (URIs) are a viral infection of the air passages leading to the lungs. A URI affects the nose, throat, and upper air passages. The most common type of URI is nasopharyngitis and is typically referred to as "the common cold." URIs run their course and usually go away on their own. Most of the time, a URI does not require medical attention, but sometimes a bacterial infection in the upper airways can follow a viral infection. This is called a secondary infection. Sinus and middle ear infections are common types of secondary upper respiratory infections. Bacterial pneumonia can also complicate a URI. A URI can worsen asthma and chronic obstructive pulmonary disease (COPD). Sometimes, these complications can require emergency medical care and may be life threatening.  CAUSES Almost all URIs are caused by viruses. A virus is a type of germ and can spread from one person to another.  RISKS FACTORS You may be at risk for a URI if:   You smoke.   You have chronic heart or lung disease.  You have a weakened defense (immune) system.   You are very young or very old.   You have nasal allergies or asthma.  You work in crowded or poorly ventilated areas.  You work in health care facilities or schools. SIGNS AND SYMPTOMS  Symptoms typically develop 2-3 days after you come in contact with a cold virus. Most viral URIs last 7-10 days. However, viral URIs from the influenza virus (flu virus) can last 14-18 days and are typically more severe. Symptoms may include:   Runny or stuffy (congested) nose.   Sneezing.   Cough.   Sore throat.   Headache.   Fatigue.   Fever.   Loss of appetite.   Pain in your forehead, behind your eyes, and over your cheekbones (sinus pain).  Muscle aches.  DIAGNOSIS  Your health care provider may diagnose a URI by:  Physical exam.  Tests to check that your symptoms are not due to  another condition such as:  Strep throat.  Sinusitis.  Pneumonia.  Asthma. TREATMENT  A URI goes away on its own with time. It cannot be cured with medicines, but medicines may be prescribed or recommended to relieve symptoms. Medicines may help:  Reduce your fever.  Reduce your cough.  Relieve nasal congestion. HOME CARE INSTRUCTIONS   Take medicines only as directed by your health care provider.   Gargle warm saltwater or take cough drops to comfort your throat as directed by your health care provider.  Use a warm mist humidifier or inhale steam from a shower to increase air moisture. This may make it easier to breathe.  Drink enough fluid to keep your urine clear or pale yellow.   Eat soups and other clear broths and maintain good nutrition.   Rest as needed.   Return to work when your temperature has returned to normal or as your health care provider advises. You may need to stay home longer to avoid infecting others. You can also use a face mask and careful hand washing to prevent spread of the virus.  Increase the usage of your inhaler if you have asthma.   Do not use any tobacco products, including cigarettes, chewing tobacco, or electronic cigarettes. If you need help quitting, ask your health care provider. PREVENTION  The best way to protect yourself from getting a cold is to practice good hygiene.   Avoid oral or hand contact with people with cold   symptoms.   Wash your hands often if contact occurs.  There is no clear evidence that vitamin C, vitamin E, echinacea, or exercise reduces the chance of developing a cold. However, it is always recommended to get plenty of rest, exercise, and practice good nutrition.  SEEK MEDICAL CARE IF:   You are getting worse rather than better.   Your symptoms are not controlled by medicine.   You have chills.  You have worsening shortness of breath.  You have brown or red mucus.  You have yellow or brown nasal  discharge.  You have pain in your face, especially when you bend forward.  You have a fever.  You have swollen neck glands.  You have pain while swallowing.  You have white areas in the back of your throat. SEEK IMMEDIATE MEDICAL CARE IF:   You have severe or persistent:  Headache.  Ear pain.  Sinus pain.  Chest pain.  You have chronic lung disease and any of the following:  Wheezing.  Prolonged cough.  Coughing up blood.  A change in your usual mucus.  You have a stiff neck.  You have changes in your:  Vision.  Hearing.  Thinking.  Mood. MAKE SURE YOU:   Understand these instructions.  Will watch your condition.  Will get help right away if you are not doing well or get worse.   This information is not intended to replace advice given to you by your health care provider. Make sure you discuss any questions you have with your health care provider.   Document Released: 06/19/2000 Document Revised: 05/10/2014 Document Reviewed: 03/31/2013 Elsevier Interactive Patient Education 2016 [employer]  

## 2014-12-08 NOTE — ED Notes (Signed)
Pt reports chest pain, prod cough, vomiting, headache, intermittent since Monday. Pt states she is concerned she may have the flu.

## 2016-01-09 ENCOUNTER — Emergency Department (HOSPITAL_BASED_OUTPATIENT_CLINIC_OR_DEPARTMENT_OTHER)
Admission: EM | Admit: 2016-01-09 | Discharge: 2016-01-09 | Disposition: A | Discharge status: Home / Self Care | Payer: Medicaid Other | Attending: Physician Assistant | Admitting: Physician Assistant

## 2016-01-09 ENCOUNTER — Encounter (HOSPITAL_BASED_OUTPATIENT_CLINIC_OR_DEPARTMENT_OTHER): Payer: Self-pay | Admitting: *Deleted

## 2016-01-09 DIAGNOSIS — B9789 Other viral agents as the cause of diseases classified elsewhere: Secondary | ICD-10-CM

## 2016-01-09 DIAGNOSIS — J069 Acute upper respiratory infection, unspecified: Secondary | ICD-10-CM | POA: Insufficient documentation

## 2016-01-09 DIAGNOSIS — F17210 Nicotine dependence, cigarettes, uncomplicated: Secondary | ICD-10-CM | POA: Insufficient documentation

## 2016-01-09 MED ORDER — BENZONATATE 100 MG PO CAPS: 100.0000 mg | ORAL_CAPSULE | Freq: Three times a day (TID) | ORAL | 0 refills | Status: DC

## 2016-01-09 MED ORDER — PROMETHAZINE-DM 6.25-15 MG/5ML PO SYRP: 5.0000 mL | ORAL_SOLUTION | Freq: Four times a day (QID) | ORAL | 0 refills | Status: DC | PRN

## 2016-01-09 NOTE — ED Notes (Signed)
Pt asking how long wait time is. Instructed pt we would see her as soon as possible. Pt c/o chest hurting and states she wants her VS checked so she can leave.

## 2016-01-09 NOTE — ED Notes (Signed)
Pt repeatedly asking wait time.

## 2016-01-09 NOTE — ED Notes (Signed)
Pt talking loudly on her cell phone about how staff does not care about her welfare and that other people are going ahead of her. Pt comes to nurse first desk talking loudly about how her chest hurts. THis RN took pt to triage room and rechecked VS and triage RN roomed pt while VS were being taken to fast track.

## 2016-01-09 NOTE — ED Provider Notes (Signed)
[facility]   CSN: [id number] Arrival date & time: 01/09/16  1318     History   Chief Complaint Chief Complaint  Patient presents with  . Cough    HPI Natalie Mitchell is a 26 y.o. female.  HPI   26 year old female presents with URI symptoms. For the past 2-3 days patient has had subjective fever, chills, runny nose, congestion, sneezing, coughing, and decrease in appetite. She has tried using Mucinex at home with mild relief. She report that her son has similar symptoms a week ago. She works at [employer] and was sent here by her employer for further evaluation. She is a smoker. She denies severe headache, neck stiffness, chest pain, shortness of breath, abdominal pain, nausea vomiting diarrhea, or rash. She did not have flu shot or pneumonia shot this year.  Past Medical History:  Diagnosis Date  . Scoliosis   . Scoliosis     There are no active problems to display for this patient.   Past Surgical History:  Procedure Laterality Date  . CESAREAN SECTION      OB History    No data available       Home Medications    Prior to Admission medications   Medication Sig Start Date End Date Taking? Authorizing Provider  benzonatate (TESSALON) 100 MG capsule Take 1 capsule (100 mg total) by mouth every 8 (eight) hours. 04/16/12   Susy Frizzle, MD  cephALEXin (KEFLEX) 500 MG capsule Take 1 capsule (500 mg total) by mouth 3 (three) times daily. 05/31/14   Geoffery Lyons, MD  cyclobenzaprine (FLEXERIL) 10 MG tablet Take 1 tablet (10 mg total) by mouth 2 (two) times daily as needed for muscle spasms. 02/14/14   Elwin Mocha, MD  HYDROcodone-acetaminophen (NORCO/VICODIN) 5-325 MG per tablet Take 1 tablet by mouth every 6 (six) hours as needed for pain. 06/23/12   Gwyneth Sprout, MD  HYDROcodone-acetaminophen (NORCO/VICODIN) 5-325 MG per tablet Take 1 tablet by mouth every 6 (six) hours as needed for moderate pain. 02/14/14   Elwin Mocha, MD  lidocaine (LIDODERM) 5  % Place 1 patch onto the skin daily. Remove & Discard patch within 12 hours or as directed by MD 02/14/14   Elwin Mocha, MD  naproxen (NAPROSYN) 500 MG tablet Take 1 tablet (500 mg total) by mouth 2 (two) times daily. 10/06/14   Vanetta Mulders, MD  ondansetron (ZOFRAN) 4 MG tablet Take 1 tablet (4 mg total) by mouth every 8 (eight) hours as needed for nausea. 04/16/12   Susy Frizzle, MD  promethazine-dextromethorphan (PROMETHAZINE-DM) 6.25-15 MG/5ML syrup Take 5 mLs by mouth 4 (four) times daily as needed for cough. 12/08/14   Gwyneth Sprout, MD    Family History No family history on file.  Social History Social History  Substance Use Topics  . Smoking status: Current Every Day Smoker    Packs/day: 0.50    Types: Cigarettes  . Smokeless tobacco: Never Used  . Alcohol use Yes     Allergies   Patient has no known allergies.   Review of Systems Review of Systems  All other systems reviewed and are negative.    Physical Exam Updated Vital Signs BP (!) 143/104 (BP Location: Right Arm)   Pulse 101   Temp 98.7 F (37.1 C)   Resp 18   Ht 5\' 4"  (1.626 m)   Wt 52.2 kg   SpO2 100%   BMI 19.74 kg/m   Physical Exam  Constitutional: She appears well-developed and well-nourished. No  distress.  HENT:  Head: Atraumatic.  Right Ear: External ear normal.  Left Ear: External ear normal.  Mouth/Throat: Oropharynx is clear and moist.  Eyes: Conjunctivae are normal.  Neck: Normal range of motion. Neck supple.  No nuchal rigidity  Cardiovascular: Intact distal pulses.   Mild tachycardia without murmurs rubs or gallops  Pulmonary/Chest: Effort normal and breath sounds normal. No respiratory distress. She has no wheezes. She has no rales.  Abdominal: Soft. There is no tenderness.  Musculoskeletal: She exhibits no edema.  Lymphadenopathy:    She has no cervical adenopathy.  Neurological: She is alert.  Skin: No rash noted.  Psychiatric: She has a normal mood and affect.  Nursing  note and vitals reviewed.    ED Treatments / Results  Labs (all labs ordered are listed, but only abnormal results are displayed) Labs Reviewed - No data to display  EKG  EKG Interpretation None       Radiology No results found.  Procedures Procedures (including critical care time)  Medications Ordered in ED Medications - No data to display   Initial Impression / Assessment and Plan / ED Course  I have reviewed the triage vital signs and the nursing notes.  Pertinent labs & imaging results that were available during my care of the patient were reviewed by me and considered in my medical decision making (see chart for details).  Clinical Course     BP (!) 143/104 (BP Location: Right Arm)   Pulse 101   Temp 98.7 F (37.1 C)   Resp 18   Ht 5\' 4"  (1.626 m)   Wt 52.2 kg   SpO2 100%   BMI 19.74 kg/m    Final Clinical Impressions(s) / ED Diagnoses   Final diagnoses:  Viral URI with cough    New Prescriptions Current Discharge Medication List     4:11 PM Patient here with symptoms suggestive of viral URI. She is well-appearing. She is mildly tachycardic however she is afebrile. I have low suspicion for PE as the cause of her symptoms. Will provide symptomatic treatment. Explained to patient that her condition may last 1-2 weeks has a normal expected course. Return precaution discussed. Will provide a work note as requested.   Fayrene HelperBowie Easton Sivertson, PA-C 01/09/16 1613    Courteney Randall AnLyn Mackuen, MD 01/10/16 1625

## 2016-01-09 NOTE — ED Notes (Signed)
ED Provider at bedside. 

## 2016-05-15 ENCOUNTER — Emergency Department (HOSPITAL_BASED_OUTPATIENT_CLINIC_OR_DEPARTMENT_OTHER): Payer: Medicaid Other

## 2016-05-15 ENCOUNTER — Emergency Department (HOSPITAL_BASED_OUTPATIENT_CLINIC_OR_DEPARTMENT_OTHER)
Admission: EM | Admit: 2016-05-15 | Discharge: 2016-05-15 | Disposition: A | Discharge status: Home / Self Care | Payer: Medicaid Other | Attending: Emergency Medicine | Admitting: Emergency Medicine

## 2016-05-15 ENCOUNTER — Encounter (HOSPITAL_BASED_OUTPATIENT_CLINIC_OR_DEPARTMENT_OTHER): Payer: Self-pay | Admitting: *Deleted

## 2016-05-15 DIAGNOSIS — Z79899 Other long term (current) drug therapy: Secondary | ICD-10-CM | POA: Insufficient documentation

## 2016-05-15 DIAGNOSIS — Y939 Activity, unspecified: Secondary | ICD-10-CM | POA: Insufficient documentation

## 2016-05-15 DIAGNOSIS — Y929 Unspecified place or not applicable: Secondary | ICD-10-CM | POA: Insufficient documentation

## 2016-05-15 DIAGNOSIS — W230XXA Caught, crushed, jammed, or pinched between moving objects, initial encounter: Secondary | ICD-10-CM | POA: Insufficient documentation

## 2016-05-15 DIAGNOSIS — F17210 Nicotine dependence, cigarettes, uncomplicated: Secondary | ICD-10-CM | POA: Insufficient documentation

## 2016-05-15 DIAGNOSIS — Y999 Unspecified external cause status: Secondary | ICD-10-CM | POA: Insufficient documentation

## 2016-05-15 DIAGNOSIS — S92524A Nondisplaced fracture of medial phalanx of right lesser toe(s), initial encounter for closed fracture: Secondary | ICD-10-CM | POA: Insufficient documentation

## 2016-05-15 MED ORDER — IBUPROFEN 800 MG PO TABS: 800.0000 mg | ORAL_TABLET | Freq: Three times a day (TID) | ORAL | 0 refills | Status: DC | PRN

## 2016-05-15 MED ORDER — IBUPROFEN 800 MG PO TABS
800.0000 mg | ORAL_TABLET | Freq: Once | ORAL | Status: AC
  Administered 2016-05-15: 800 mg via ORAL
  Filled 2016-05-15: qty 1

## 2016-05-15 NOTE — ED Notes (Signed)
ED Provider at bedside. 

## 2016-05-15 NOTE — ED Notes (Signed)
Work note given. Pt ambulatory without need for assist at discharge

## 2016-05-15 NOTE — ED Provider Notes (Signed)
[facility]   CSN: [id number] Arrival date & time: 05/15/16  4782     History   Chief Complaint Chief Complaint  Patient presents with  . Foot Pain    HPI Natalie Mitchell is a 26 y.o. female presenting after a trip and fall yesterday and sudden onset sharp right second toe pain. She reports tenderness and ecchymosis. Pain worse with walking. She has not tried anything for pain, but has tried elevating her extremity last night. She denies any other injuries of symptoms.  HPI  Past Medical History:  Diagnosis Date  . Scoliosis   . Scoliosis     There are no active problems to display for this patient.   Past Surgical History:  Procedure Laterality Date  . CESAREAN SECTION      OB History    No data available       Home Medications    Prior to Admission medications   Medication Sig Start Date End Date Taking? Authorizing Provider  benzonatate (TESSALON) 100 MG capsule Take 1 capsule (100 mg total) by mouth every 8 (eight) hours. 01/09/16   Fayrene Helper, PA-C  cephALEXin (KEFLEX) 500 MG capsule Take 1 capsule (500 mg total) by mouth 3 (three) times daily. 05/31/14   Geoffery Lyons, MD  cyclobenzaprine (FLEXERIL) 10 MG tablet Take 1 tablet (10 mg total) by mouth 2 (two) times daily as needed for muscle spasms. 02/14/14   Elwin Mocha, MD  HYDROcodone-acetaminophen (NORCO/VICODIN) 5-325 MG per tablet Take 1 tablet by mouth every 6 (six) hours as needed for pain. 06/23/12   Gwyneth Sprout, MD  HYDROcodone-acetaminophen (NORCO/VICODIN) 5-325 MG per tablet Take 1 tablet by mouth every 6 (six) hours as needed for moderate pain. 02/14/14   Elwin Mocha, MD  ibuprofen (ADVIL,MOTRIN) 800 MG tablet Take 1 tablet (800 mg total) by mouth every 8 (eight) hours as needed. 05/15/16   Mathews Robinsons B, PA-C  lidocaine (LIDODERM) 5 % Place 1 patch onto the skin daily. Remove & Discard patch within 12 hours or as directed by MD 02/14/14   Elwin Mocha, MD  naproxen  (NAPROSYN) 500 MG tablet Take 1 tablet (500 mg total) by mouth 2 (two) times daily. 10/06/14   Vanetta Mulders, MD  ondansetron (ZOFRAN) 4 MG tablet Take 1 tablet (4 mg total) by mouth every 8 (eight) hours as needed for nausea. 04/16/12   Susy Frizzle, MD  promethazine-dextromethorphan (PROMETHAZINE-DM) 6.25-15 MG/5ML syrup Take 5 mLs by mouth 4 (four) times daily as needed for cough. 01/09/16   Fayrene Helper, PA-C    Family History No family history on file.  Social History Social History  Substance Use Topics  . Smoking status: Current Every Day Smoker    Packs/day: 0.50    Types: Cigarettes  . Smokeless tobacco: Never Used  . Alcohol use Yes     Comment: 3x week     Allergies   Patient has no known allergies.   Review of Systems Review of Systems  Constitutional: Negative for chills and fever.  Respiratory: Negative for shortness of breath and wheezing.   Cardiovascular: Negative for chest pain.  Gastrointestinal: Negative for nausea and vomiting.  Musculoskeletal: Positive for arthralgias and gait problem.  Skin: Positive for color change. Negative for pallor, rash and wound.  Neurological: Negative for weakness and numbness.     Physical Exam Updated Vital Signs BP (!) 132/97 (BP Location: Right Arm)   Pulse 79   Temp 98.7 F (37.1 C) (Oral)  Resp 18   Ht 5\' 4"  (1.626 m)   Wt 56.7 kg   SpO2 100%   BMI 21.46 kg/m   Physical Exam  Constitutional: She appears well-developed and well-nourished. No distress.  Eyes: EOM are normal.  Neck: Normal range of motion.  Cardiovascular: Normal rate, regular rhythm, normal heart sounds and intact distal pulses.   Pulmonary/Chest: Effort normal and breath sounds normal. No respiratory distress. She has no wheezes. She has no rales. She exhibits no tenderness.  Musculoskeletal: Normal range of motion. She exhibits tenderness. She exhibits no edema or deformity.  Full rom, no sensory deficits, good circulation. NVI    Neurological: No sensory deficit.  Skin: Skin is warm and dry. She is not diaphoretic. No erythema. No pallor.  Small area of ecchymosis at the DIP of second right toe.      ED Treatments / Results  Labs (all labs ordered are listed, but only abnormal results are displayed) Labs Reviewed - No data to display  EKG  EKG Interpretation None       Radiology Dg Foot Complete Right  Result Date: 05/15/2016 CLINICAL DATA:  Tripped yesterday on a swing set with right second toe injury. Swelling and deformity. Initial encounter. EXAM: RIGHT FOOT COMPLETE - 3+ VIEW COMPARISON:  Right second toe radiographs 10/06/2014 FINDINGS: There is a nondisplaced, predominantly transversely oriented fracture through the base of the middle phalanx of the second toe. Intraarticular extension is not clearly demonstrated but is not excluded as the fracture is only well seen on the lateral radiograph. There is associated soft tissue swelling. There is no dislocation. Bone mineralization appears normal. IMPRESSION: Nondisplaced second toe middle phalanx fracture. Electronically Signed   By: Sebastian AcheAllen  Grady M.D.   On: 05/15/2016 10:28    Procedures Procedures (including critical care time) SPLINT APPLICATION Date/Time: 11:37 AM Authorized by: Georgiana ShoreJessica B Mitchell Consent: Verbal consent obtained. Risks and benefits: risks, benefits and alternatives were discussed Consent given by: patient Splint applied by: orthopedic technician Location details: rightfoot Splint type: buddy tape and post-op boot Supplies used: tape, post-op boot Post-procedure: The splinted body part was neurovascularly unchanged following the procedure. Patient tolerance: Patient tolerated the procedure well with no immediate complications.    Medications Ordered in ED Medications  ibuprofen (ADVIL,MOTRIN) tablet 800 mg (800 mg Oral Given 05/15/16 1115)     Initial Impression / Assessment and Plan / ED Course  I have reviewed the  triage vital signs and the nursing notes.  Pertinent labs & imaging results that were available during my care of the patient were reviewed by me and considered in my medical decision making (see chart for details).     Patient presents with nondisplaced distal fracture of middle phalanx of second right toe after trip and fall. No other injuries. Neurovascularly intact.  Patient's pain was treated and toe was buddy-tapped, right foot was placed in a postop shoe. Exam otherwise unremarkable, patient is well-appearing and stable for discharge.   Discharge home with symptomatic relief, rice protocol, and close follow-up with orthopedics.  Discussed strict return precautions and advised to return to the emergency department if experiencing any new or worsening symptoms. Instructions were understood and patient agreed with discharge plan.  Final Clinical Impressions(s) / ED Diagnoses   Final diagnoses:  Nondisplaced fracture of middle phalanx of right lesser toe(s), initial encounter for closed fracture    New Prescriptions New Prescriptions   IBUPROFEN (ADVIL,MOTRIN) 800 MG TABLET    Take 1 tablet (800 mg total)  by mouth every 8 (eight) hours as needed.     Georgiana Shore, PA-C 05/15/16 1137    Melene Plan, DO 05/15/16 1510

## 2016-05-15 NOTE — ED Triage Notes (Signed)
Pt states she tripped over outdoor swing yesterday and stubbed right second toe. Bruising noted

## 2016-05-15 NOTE — ED Notes (Signed)
Pt refused wheelchair in triage. 

## 2016-05-15 NOTE — Discharge Instructions (Signed)
As discussed, please follow the Rice protocol outlined in this discharge summary. Follow-up with orthopedics. Ibuprofen for pain and swelling.  Follow-up with orthopedics. Return to the emergency department if you experience numbness, tingling, swelling, increased pain, redness or warmth around the toe or any other new concerning symptoms in the meantime.

## 2016-06-27 ENCOUNTER — Encounter (HOSPITAL_BASED_OUTPATIENT_CLINIC_OR_DEPARTMENT_OTHER): Payer: Self-pay

## 2016-06-27 ENCOUNTER — Emergency Department (HOSPITAL_BASED_OUTPATIENT_CLINIC_OR_DEPARTMENT_OTHER): Payer: Self-pay

## 2016-06-27 ENCOUNTER — Emergency Department (HOSPITAL_BASED_OUTPATIENT_CLINIC_OR_DEPARTMENT_OTHER)
Admission: EM | Admit: 2016-06-27 | Discharge: 2016-06-27 | Disposition: A | Discharge status: Home / Self Care | Payer: Self-pay | Attending: Emergency Medicine | Admitting: Emergency Medicine

## 2016-06-27 DIAGNOSIS — F17210 Nicotine dependence, cigarettes, uncomplicated: Secondary | ICD-10-CM | POA: Insufficient documentation

## 2016-06-27 DIAGNOSIS — Z79899 Other long term (current) drug therapy: Secondary | ICD-10-CM | POA: Insufficient documentation

## 2016-06-27 DIAGNOSIS — R1031 Right lower quadrant pain: Secondary | ICD-10-CM | POA: Insufficient documentation

## 2016-06-27 DIAGNOSIS — Z791 Long term (current) use of non-steroidal anti-inflammatories (NSAID): Secondary | ICD-10-CM | POA: Insufficient documentation

## 2016-06-27 HISTORY — DX: Gastro-esophageal reflux disease without esophagitis: K21.9

## 2016-06-27 LAB — COMPREHENSIVE METABOLIC PANEL
ALT: 30 U/L (ref 14–54)
ANION GAP: 10 (ref 5–15)
AST: 27 U/L (ref 15–41)
Albumin: 4.7 g/dL (ref 3.5–5.0)
Alkaline Phosphatase: 57 U/L (ref 38–126)
BUN: 10 mg/dL (ref 6–20)
CHLORIDE: 101 mmol/L (ref 101–111)
CO2: 25 mmol/L (ref 22–32)
Calcium: 9.9 mg/dL (ref 8.9–10.3)
Creatinine, Ser: 0.64 mg/dL (ref 0.44–1.00)
Glucose, Bld: 92 mg/dL (ref 65–99)
POTASSIUM: 3.7 mmol/L (ref 3.5–5.1)
SODIUM: 136 mmol/L (ref 135–145)
Total Bilirubin: 0.5 mg/dL (ref 0.3–1.2)
Total Protein: 8.9 g/dL — ABNORMAL HIGH (ref 6.5–8.1)

## 2016-06-27 LAB — URINALYSIS, ROUTINE W REFLEX MICROSCOPIC
Bilirubin Urine: NEGATIVE
Glucose, UA: NEGATIVE mg/dL
KETONES UR: NEGATIVE mg/dL
NITRITE: NEGATIVE
PH: 6 (ref 5.0–8.0)
Protein, ur: NEGATIVE mg/dL
Specific Gravity, Urine: 1.023 (ref 1.005–1.030)

## 2016-06-27 LAB — CBC WITH DIFFERENTIAL/PLATELET
Basophils Absolute: 0 10*3/uL (ref 0.0–0.1)
Basophils Relative: 0 %
EOS ABS: 0.1 10*3/uL (ref 0.0–0.7)
EOS PCT: 1 %
HCT: 43.2 % (ref 36.0–46.0)
Hemoglobin: 14.5 g/dL (ref 12.0–15.0)
LYMPHS ABS: 2 10*3/uL (ref 0.7–4.0)
Lymphocytes Relative: 40 %
MCH: 27.8 pg (ref 26.0–34.0)
MCHC: 33.6 g/dL (ref 30.0–36.0)
MCV: 82.9 fL (ref 78.0–100.0)
MONO ABS: 0.6 10*3/uL (ref 0.1–1.0)
MONOS PCT: 13 %
Neutro Abs: 2.3 10*3/uL (ref 1.7–7.7)
Neutrophils Relative %: 46 %
PLATELETS: 190 10*3/uL (ref 150–400)
RBC: 5.21 MIL/uL — AB (ref 3.87–5.11)
RDW: 14.6 % (ref 11.5–15.5)
WBC: 5 10*3/uL (ref 4.0–10.5)

## 2016-06-27 LAB — URINALYSIS, MICROSCOPIC (REFLEX)

## 2016-06-27 LAB — LIPASE, BLOOD: LIPASE: 28 U/L (ref 11–51)

## 2016-06-27 LAB — PREGNANCY, URINE: PREG TEST UR: NEGATIVE

## 2016-06-27 MED ORDER — ONDANSETRON HCL 4 MG/2ML IJ SOLN
4.0000 mg | Freq: Once | INTRAMUSCULAR | Status: AC
  Administered 2016-06-27: 4 mg via INTRAVENOUS
  Filled 2016-06-27: qty 2

## 2016-06-27 MED ORDER — MORPHINE SULFATE (PF) 4 MG/ML IV SOLN
4.0000 mg | Freq: Once | INTRAVENOUS | Status: AC
  Administered 2016-06-27: 4 mg via INTRAVENOUS
  Filled 2016-06-27 (×2): qty 1

## 2016-06-27 MED ORDER — ONDANSETRON HCL 4 MG PO TABS
4.0000 mg | ORAL_TABLET | Freq: Three times a day (TID) | ORAL | 0 refills | Status: DC | PRN
Start: 2016-06-27 — End: 2019-03-21

## 2016-06-27 MED ORDER — LACTATED RINGERS IV BOLUS (SEPSIS)
1000.0000 mL | Freq: Once | INTRAVENOUS | Status: AC
  Administered 2016-06-27: 1000 mL via INTRAVENOUS

## 2016-06-27 MED ORDER — IOPAMIDOL (ISOVUE-300) INJECTION 61%
100.0000 mL | Freq: Once | INTRAVENOUS | Status: AC | PRN
  Administered 2016-06-27: 100 mL via INTRAVENOUS

## 2016-06-27 NOTE — ED Triage Notes (Addendum)
C/o abd pain, n/v/d x 2 days-NAD-steady gait-was drinking slushie upon arrival

## 2016-06-27 NOTE — ED Notes (Signed)
Pt in CT at this time.

## 2016-06-27 NOTE — ED Provider Notes (Signed)
[facility]   CSN: [id number] Arrival date & time: 06/27/16  1425     History   Chief Complaint Chief Complaint  Patient presents with  . Abdominal Pain    HPI Natalie Mitchell is a 26 y.o. female.  The history is provided by the patient.  Abdominal Pain   This is a new problem. The current episode started more than 2 days ago. The problem occurs constantly. The problem has been gradually worsening. The pain is associated with eating. The pain is located in the RLQ.    Past Medical History:  Diagnosis Date  . GERD (gastroesophageal reflux disease)   . Scoliosis   . Scoliosis     There are no active problems to display for this patient.   Past Surgical History:  Procedure Laterality Date  . CESAREAN SECTION      OB History    No data available       Home Medications    Prior to Admission medications   Medication Sig Start Date End Date Taking? Authorizing Provider  benzonatate (TESSALON) 100 MG capsule Take 1 capsule (100 mg total) by mouth every 8 (eight) hours. 01/09/16   Fayrene Helperran, Bowie, PA-C  cephALEXin (KEFLEX) 500 MG capsule Take 1 capsule (500 mg total) by mouth 3 (three) times daily. 05/31/14   Geoffery Lyonselo, Douglas, MD  cyclobenzaprine (FLEXERIL) 10 MG tablet Take 1 tablet (10 mg total) by mouth 2 (two) times daily as needed for muscle spasms. 02/14/14   Elwin MochaWalden, Blair, MD  HYDROcodone-acetaminophen (NORCO/VICODIN) 5-325 MG per tablet Take 1 tablet by mouth every 6 (six) hours as needed for pain. 06/23/12   Gwyneth SproutPlunkett, Whitney, MD  HYDROcodone-acetaminophen (NORCO/VICODIN) 5-325 MG per tablet Take 1 tablet by mouth every 6 (six) hours as needed for moderate pain. 02/14/14   Elwin MochaWalden, Blair, MD  ibuprofen (ADVIL,MOTRIN) 800 MG tablet Take 1 tablet (800 mg total) by mouth every 8 (eight) hours as needed. 05/15/16   Mathews RobinsonsMitchell, Jessica B, PA-C  lidocaine (LIDODERM) 5 % Place 1 patch onto the skin daily. Remove & Discard patch within 12 hours or as directed by MD  02/14/14   Elwin MochaWalden, Blair, MD  naproxen (NAPROSYN) 500 MG tablet Take 1 tablet (500 mg total) by mouth 2 (two) times daily. 10/06/14   Vanetta MuldersZackowski, Scott, MD  ondansetron (ZOFRAN) 4 MG tablet Take 1 tablet (4 mg total) by mouth every 8 (eight) hours as needed for nausea or vomiting. 06/27/16   Dagny Fiorentino, Barbara CowerJason, MD  promethazine-dextromethorphan (PROMETHAZINE-DM) 6.25-15 MG/5ML syrup Take 5 mLs by mouth 4 (four) times daily as needed for cough. 01/09/16   Fayrene Helperran, Bowie, PA-C    Family History No family history on file.  Social History Social History  Substance Use Topics  . Smoking status: Current Every Day Smoker    Packs/day: 0.50    Types: Cigarettes  . Smokeless tobacco: Never Used  . Alcohol use Yes     Comment: daily     Allergies   Patient has no known allergies.   Review of Systems Review of Systems  Gastrointestinal: Positive for abdominal pain.  All other systems reviewed and are negative.    Physical Exam Updated Vital Signs BP 140/90 (BP Location: Left Arm)   Pulse (!) 54   Temp 98.3 F (36.8 C) (Oral)   Resp 16   Wt 53.3 kg (117 lb 7 oz)   SpO2 100%   BMI 20.16 kg/m   Physical Exam  Constitutional: She is oriented to person,  place, and time. She appears well-developed and well-nourished.  HENT:  Head: Normocephalic and atraumatic.  Eyes: Conjunctivae and EOM are normal.  Neck: Normal range of motion.  Cardiovascular: Normal rate and regular rhythm.  Exam reveals no friction rub.   No murmur heard. Pulmonary/Chest: No stridor. No respiratory distress.  Abdominal: Soft. Bowel sounds are normal. She exhibits no distension. There is tenderness (RLQ. positive rosvings sign).  Musculoskeletal: Normal range of motion. She exhibits no edema or deformity.  Neurological: She is alert and oriented to person, place, and time. No cranial nerve deficit. Coordination normal.  Skin: Skin is warm and dry. No erythema. No pallor.  Nursing note and vitals reviewed.    ED  Treatments / Results  Labs (all labs ordered are listed, but only abnormal results are displayed) Labs Reviewed  URINALYSIS, ROUTINE W REFLEX MICROSCOPIC - Abnormal; Notable for the following:       Result Value   APPearance CLOUDY (*)    Hgb urine dipstick SMALL (*)    Leukocytes, UA SMALL (*)    All other components within normal limits  URINALYSIS, MICROSCOPIC (REFLEX) - Abnormal; Notable for the following:    Bacteria, UA RARE (*)    Squamous Epithelial / LPF 6-30 (*)    All other components within normal limits  CBC WITH DIFFERENTIAL/PLATELET - Abnormal; Notable for the following:    RBC 5.21 (*)    All other components within normal limits  COMPREHENSIVE METABOLIC PANEL - Abnormal; Notable for the following:    Total Protein 8.9 (*)    All other components within normal limits  PREGNANCY, URINE  LIPASE, BLOOD    EKG  EKG Interpretation None       Radiology Ct Abdomen Pelvis W Contrast  Result Date: 06/27/2016 CLINICAL DATA:  Right lower quadrant abdomen pain for 2 days EXAM: CT ABDOMEN AND PELVIS WITH CONTRAST TECHNIQUE: Multidetector CT imaging of the abdomen and pelvis was performed using the standard protocol following bolus administration of intravenous contrast. CONTRAST:  ISOVUE-300 IOPAMIDOL (ISOVUE-300) INJECTION 61% COMPARISON:  July 24, 2015 FINDINGS: Lower chest: No acute abnormality. Hepatobiliary: No focal liver abnormality is seen. No gallstones, gallbladder wall thickening, or biliary dilatation. Pancreas: Unremarkable. No pancreatic ductal dilatation or surrounding inflammatory changes. Spleen: Normal in size without focal abnormality. Adrenals/Urinary Tract: The adrenal glands are normal. No focal renal lesion is identified. There is mild hydronephrosis bilaterally without focal discrete obstructing stone identified in the bilateral collecting systems. The bladder is normal. Stomach/Bowel: Stomach is within normal limits. The appendix is not seen but no  inflammatory changes noted around the cecum to suggest appendicitis. No evidence of bowel wall thickening, distention, or inflammatory changes. Vascular/Lymphatic: No significant vascular findings are present. No enlarged abdominal or pelvic lymph nodes. Reproductive: Uterus and bilateral adnexa are unremarkable. Other: None. Musculoskeletal: No acute or significant osseous findings. IMPRESSION: The appendix is not seen but no inflammatory changes noted around the cecum to suggest appendicitis. Mild bilateral hydronephrosis without focal discrete obstructing stone identified in bilateral collecting systems. Electronically Signed   By: Sherian Rein M.D.   On: 06/27/2016 18:40    Procedures Procedures (including critical care time)  Medications Ordered in ED Medications  lactated ringers bolus 1,000 mL (0 mLs Intravenous Stopped 06/27/16 1843)  morphine 4 MG/ML injection 4 mg (4 mg Intravenous Given 06/27/16 1735)  ondansetron (ZOFRAN) injection 4 mg (4 mg Intravenous Given 06/27/16 1640)  iopamidol (ISOVUE-300) 61 % injection 100 mL (100 mLs Intravenous Contrast Given  06/27/16 1824)     Initial Impression / Assessment and Plan / ED Course  I have reviewed the triage vital signs and the nursing notes.  Pertinent labs & imaging results that were available during my care of the patient were reviewed by me and considered in my medical decision making (see chart for details).    Will eval for appendicitis.  Ct negative. Tolerating PO. Pain improved. Stable for dc.   Final Clinical Impressions(s) / ED Diagnoses   Final diagnoses:  Right lower quadrant abdominal pain    New Prescriptions Discharge Medication List as of 06/27/2016  7:43 PM       Chasin Findling, Barbara Cower, MD 06/28/16 0020

## 2016-09-27 ENCOUNTER — Encounter (HOSPITAL_BASED_OUTPATIENT_CLINIC_OR_DEPARTMENT_OTHER): Payer: Self-pay

## 2016-09-27 ENCOUNTER — Emergency Department (HOSPITAL_BASED_OUTPATIENT_CLINIC_OR_DEPARTMENT_OTHER)
Admission: EM | Admit: 2016-09-27 | Discharge: 2016-09-27 | Disposition: A | Discharge status: Home / Self Care | Payer: Self-pay | Attending: Emergency Medicine | Admitting: Emergency Medicine

## 2016-09-27 DIAGNOSIS — R55 Syncope and collapse: Secondary | ICD-10-CM | POA: Insufficient documentation

## 2016-09-27 DIAGNOSIS — Z5321 Procedure and treatment not carried out due to patient leaving prior to being seen by health care provider: Secondary | ICD-10-CM | POA: Insufficient documentation

## 2016-09-27 LAB — CBC
HEMATOCRIT: 41.5 % (ref 36.0–46.0)
HEMOGLOBIN: 14.1 g/dL (ref 12.0–15.0)
MCH: 28.3 pg (ref 26.0–34.0)
MCHC: 34 g/dL (ref 30.0–36.0)
MCV: 83.2 fL (ref 78.0–100.0)
Platelets: 227 10*3/uL (ref 150–400)
RBC: 4.99 MIL/uL (ref 3.87–5.11)
RDW: 14.7 % (ref 11.5–15.5)
WBC: 6.4 10*3/uL (ref 4.0–10.5)

## 2016-09-27 LAB — BASIC METABOLIC PANEL
ANION GAP: 8 (ref 5–15)
BUN: 7 mg/dL (ref 6–20)
CALCIUM: 9.8 mg/dL (ref 8.9–10.3)
CHLORIDE: 105 mmol/L (ref 101–111)
CO2: 25 mmol/L (ref 22–32)
Creatinine, Ser: 0.66 mg/dL (ref 0.44–1.00)
GFR calc non Af Amer: >60 mL/min (ref >60)
GLUCOSE: 108 mg/dL — AB (ref 65–99)
POTASSIUM: 3.4 mmol/L — AB (ref 3.5–5.1)
Sodium: 138 mmol/L (ref 135–145)

## 2016-09-27 LAB — URINALYSIS, MICROSCOPIC (REFLEX): RBC / HPF: NONE SEEN RBC/hpf (ref 0–5)

## 2016-09-27 LAB — URINALYSIS, ROUTINE W REFLEX MICROSCOPIC
BILIRUBIN URINE: NEGATIVE
Glucose, UA: NEGATIVE mg/dL
Hgb urine dipstick: NEGATIVE
Ketones, ur: NEGATIVE mg/dL
NITRITE: NEGATIVE
Protein, ur: NEGATIVE mg/dL
pH: 6 (ref 5.0–8.0)

## 2016-09-27 NOTE — ED Triage Notes (Signed)
Pt reports near syncopal while at work today. Friend reports pt was hyperventilating. Pt also c/o hand pain.

## 2016-10-01 ENCOUNTER — Encounter (HOSPITAL_BASED_OUTPATIENT_CLINIC_OR_DEPARTMENT_OTHER): Payer: Self-pay | Admitting: Emergency Medicine

## 2016-10-01 ENCOUNTER — Emergency Department (HOSPITAL_BASED_OUTPATIENT_CLINIC_OR_DEPARTMENT_OTHER)
Admission: EM | Admit: 2016-10-01 | Discharge: 2016-10-01 | Disposition: A | Discharge status: Home / Self Care | Payer: Medicaid Other | Attending: Emergency Medicine | Admitting: Emergency Medicine

## 2016-10-01 DIAGNOSIS — A599 Trichomoniasis, unspecified: Secondary | ICD-10-CM

## 2016-10-01 DIAGNOSIS — F17210 Nicotine dependence, cigarettes, uncomplicated: Secondary | ICD-10-CM | POA: Insufficient documentation

## 2016-10-01 DIAGNOSIS — F410 Panic disorder [episodic paroxysmal anxiety] without agoraphobia: Secondary | ICD-10-CM

## 2016-10-01 HISTORY — DX: Other chronic pain: G89.29

## 2016-10-01 LAB — PREGNANCY, URINE: Preg Test, Ur: NEGATIVE

## 2016-10-01 MED ORDER — ONDANSETRON 4 MG PO TBDP
4.0000 mg | ORAL_TABLET | Freq: Once | ORAL | Status: AC
  Administered 2016-10-01: 4 mg via ORAL
  Filled 2016-10-01: qty 1

## 2016-10-01 MED ORDER — METRONIDAZOLE 500 MG PO TABS
2000.0000 mg | ORAL_TABLET | Freq: Once | ORAL | Status: AC
  Administered 2016-10-01: 2000 mg via ORAL
  Filled 2016-10-01: qty 4

## 2016-10-01 MED ORDER — CEFTRIAXONE SODIUM 250 MG IJ SOLR
250.0000 mg | Freq: Once | INTRAMUSCULAR | Status: AC
  Administered 2016-10-01: 250 mg via INTRAMUSCULAR
  Filled 2016-10-01: qty 250

## 2016-10-01 MED ORDER — AZITHROMYCIN 250 MG PO TABS
1000.0000 mg | ORAL_TABLET | Freq: Once | ORAL | Status: AC
  Administered 2016-10-01: 1000 mg via ORAL
  Filled 2016-10-01: qty 4

## 2016-10-01 NOTE — Discharge Instructions (Signed)

## 2016-10-01 NOTE — ED Triage Notes (Signed)
Pt came in on 09-27-2016 to be seen but left before being seen.  Pt states she needs a work note to excuse her from work Friday and Monday.

## 2016-10-01 NOTE — ED Provider Notes (Signed)
Emergency Department Provider Note   I have reviewed the triage vital signs and the nursing notes.   HISTORY  Chief Complaint Needs work note   HPI Tonea Leiphart is a 26 y.o. female with PMH of GERD presents to the ED for evaluation after recent panic attack at work. The patient presented to the emergency department 3 days ago with symptoms but left without being seen. Prior to leaving the patient had blood work and urine testing done. She returns today saying that she needs a note saying she can return to work today. She's had no additional anxiety symptoms. No shortness of breath or chest pain. No fevers or chills. Patient has no history of prior anxiety attacks. Denies any vaginal bleeding, discharge, or pain.    Past Medical History:  Diagnosis Date  . Chronic pain   . GERD (gastroesophageal reflux disease)   . Scoliosis     There are no active problems to display for this patient.   Past Surgical History:  Procedure Laterality Date  . CESAREAN SECTION      Current Outpatient Rx  . Order #: [id number] Class: Print  . Order #: [id number] Class: Print  . Order #: [id number] Class: Print  . Order #: [id number] Class: Print  . Order #: [id number] Class: Print  . Order #: [id number] Class: Print  . Order #: [id number] Class: Print  . Order #: [id number] Class: Print  . Order #: [id number] Class: Print  . Order #: [id number] Class: Print    Allergies Patient has no known allergies.  No family history on file.  Social History Social History  Substance Use Topics  . Smoking status: Current Every Day Smoker    Packs/day: 0.50    Types: Cigarettes  . Smokeless tobacco: Never Used  . Alcohol use Yes     Comment: daily    Review of Systems  Constitutional: No fever/chills. Panic attack symptoms 4 days prior.  Eyes: No visual changes. ENT: No sore throat. Cardiovascular: Denies chest pain. Respiratory: Denies shortness of breath. Gastrointestinal: No abdominal pain. No nausea, no  vomiting.  No diarrhea.  No constipation. Genitourinary: Negative for dysuria. Musculoskeletal: Negative for back pain. Skin: Negative for rash. Neurological: Negative for headaches, focal weakness or numbness.  10-point ROS otherwise negative.  ____________________________________________   PHYSICAL EXAM:  VITAL SIGNS: ED Triage Vitals [10/01/16 0849]  Enc Vitals Group     BP (!) 119/99     Pulse Rate 69     Resp 18     Temp 98.3 F (36.8 C)     Temp Source Oral     SpO2 99 %     Weight 120 lb (54.4 kg)     Height  (1.575 m)    Constitutional: Alert and oriented. Well appearing and in no acute distress. Eyes: Conjunctivae are normal.  Head: Atraumatic. Nose: No congestion/rhinnorhea. Mouth/Throat: Mucous membranes are moist.  Neck: No stridor.  Cardiovascular: Normal rate, regular rhythm. Good peripheral circulation. Grossly normal heart sounds.   Respiratory: Normal respiratory effort.  No retractions. Lungs CTAB. Gastrointestinal: Soft and nontender. No distention.  Musculoskeletal: No lower extremity tenderness nor edema. No gross deformities of extremities. Neurologic:  Normal speech and language. No gross focal neurologic deficits are appreciated.  Skin:  Skin is warm, dry and intact. No rash noted.  ____________________________________________   LABS (all labs ordered are listed, but only abnormal results are displayed)  Labs Reviewed  PREGNANCY, URINE   ____________________________________________   PROCEDURES  Procedure(s) performed:   Procedures  None ____________________________________________  INITIAL IMPRESSION / ASSESSMENT AND PLAN / ED COURSE  Pertinent labs & imaging results that were available during my care of the patient were reviewed by me and considered in my medical decision making (see chart for details).  Patient resents to the emergency room in for evaluation after panic attacks several days ago. She needs a note to be  able to return to work. Lab test from her prior visit reviewed which showed positive trichomonas on the UA. Patient is having no pelvic or GU symptoms. Plan to add urine pregnancy and treat for gonorrhea, chlamydia, and Trichomonas. Discussed importance of partner notification and need to have any current sexual partners tested prior to resuming sexual activity to avoid re-infection.   At this time, I do not feel there is any life-threatening condition present. I have reviewed and discussed all results (EKG, imaging, lab, urine as appropriate), exam findings with patient. I have reviewed nursing notes and appropriate previous records.  I feel the patient is safe to be discharged home without further emergent workup. Discussed usual and customary return precautions. Patient and family (if present) verbalize understanding and are comfortable with this plan.  Patient will follow-up with their primary care provider. If they do not have a primary care provider, information for follow-up has been provided to them. All questions have been answered.  ____________________________________________  FINAL CLINICAL IMPRESSION(S) / ED DIAGNOSES  Final diagnoses:  Panic attack  Trichimoniasis     MEDICATIONS GIVEN DURING THIS VISIT:  Medications  metroNIDAZOLE (FLAGYL) tablet 2,000 mg (2,000 mg Oral Given 10/01/16 1003)  cefTRIAXone (ROCEPHIN) injection 250 mg (250 mg Intramuscular Given 10/01/16 0950)  azithromycin (ZITHROMAX) tablet 1,000 mg (1,000 mg Oral Given 10/01/16 0949)  ondansetron (ZOFRAN-ODT) disintegrating tablet 4 mg (4 mg Oral Given 10/01/16 1019)     NEW OUTPATIENT MEDICATIONS STARTED DURING THIS VISIT:  None   Note:  This document was prepared using Dragon voice recognition software and may include unintentional dictation errors.  Alona Bene, MD Emergency Medicine    Long, Arlyss Repress, MD 10/02/16 [phone]

## 2016-10-01 NOTE — ED Notes (Signed)
ED Provider at bedside. 

## 2019-03-21 ENCOUNTER — Encounter (HOSPITAL_BASED_OUTPATIENT_CLINIC_OR_DEPARTMENT_OTHER): Payer: Self-pay | Admitting: Emergency Medicine

## 2019-03-21 ENCOUNTER — Emergency Department (HOSPITAL_BASED_OUTPATIENT_CLINIC_OR_DEPARTMENT_OTHER): Payer: Self-pay

## 2019-03-21 ENCOUNTER — Emergency Department (HOSPITAL_BASED_OUTPATIENT_CLINIC_OR_DEPARTMENT_OTHER)
Admission: EM | Admit: 2019-03-21 | Discharge: 2019-03-21 | Disposition: A | Discharge status: Home / Self Care | Payer: Self-pay | Attending: Emergency Medicine | Admitting: Emergency Medicine

## 2019-03-21 ENCOUNTER — Other Ambulatory Visit: Payer: Self-pay

## 2019-03-21 DIAGNOSIS — M25531 Pain in right wrist: Secondary | ICD-10-CM | POA: Insufficient documentation

## 2019-03-21 DIAGNOSIS — M25461 Effusion, right knee: Secondary | ICD-10-CM | POA: Insufficient documentation

## 2019-03-21 DIAGNOSIS — F17210 Nicotine dependence, cigarettes, uncomplicated: Secondary | ICD-10-CM | POA: Insufficient documentation

## 2019-03-21 MED ORDER — HYDROCODONE-ACETAMINOPHEN 5-325 MG PO TABS
1.0000 | ORAL_TABLET | Freq: Once | ORAL | Status: AC
  Administered 2019-03-21: 1 via ORAL
  Filled 2019-03-21: qty 1

## 2019-03-21 MED ORDER — OXYCODONE-ACETAMINOPHEN 5-325 MG PO TABS: 1.0000 | ORAL_TABLET | Freq: Four times a day (QID) | ORAL | 0 refills | Status: AC | PRN

## 2019-03-21 MED ORDER — ONDANSETRON HCL 4 MG PO TABS
4.0000 mg | ORAL_TABLET | Freq: Once | ORAL | Status: DC
  Filled 2019-03-21: qty 1

## 2019-03-21 MED ORDER — ONDANSETRON 4 MG PO TBDP: 4.0000 mg | ORAL_TABLET | Freq: Once | ORAL | Status: DC

## 2019-03-21 MED ORDER — DEXAMETHASONE SODIUM PHOSPHATE 10 MG/ML IJ SOLN
10.0000 mg | Freq: Once | INTRAMUSCULAR | Status: AC
  Administered 2019-03-21: 10 mg via INTRAMUSCULAR
  Filled 2019-03-21: qty 1

## 2019-03-21 MED ORDER — ONDANSETRON 4 MG PO TBDP
ORAL_TABLET | ORAL | Status: AC
  Filled 2019-03-21: qty 1

## 2019-03-21 NOTE — ED Provider Notes (Signed)
[facility] EMERGENCY DEPARTMENT Provider Note   CSN: [id number] Arrival date & time: 03/21/19  1342     History Chief Complaint  Patient presents with  . Knee Pain  . Wrist Pain    Natalie Mitchell is a 29 y.o. female with past medical history significant for chronic pain, GERD, scoliosis presents emergency department today with chief complaint of progressively worsening right knee and wrist pain and swelling x2 days.  Patient describes the pain as sharp and aching. She states the pain is constant.  She rates it 10 of 10 in severity. She denies any known injury that caused the pain.  She states since the pain started she did had a fall while trying to get to the emergency department today.  She has having difficulty walking, getting dressed and performing her activities of daily living because of the pain.  She was recently prescribed muscle relaxers and states she has been taking those without any improvement of pain.  No other medications tried for pain prior to arrival. She denies fever, chills, chest pain, back pain, numbness, tingling, weakness.  Denies history of blood clots or recent long travel or immobilization. Of note patient was seen in outside ED on 03/18/2019 for nausea vomiting diarrhea.  She had a CT A/P that showed possible enteritis and labs were remarkable for leukocytosis and UTI.  She was discharged with muscle relaxer and Macrobid.   History provided by patient with additional history obtained from chart review.     Past Medical History:  Diagnosis Date  . Chronic pain   . GERD (gastroesophageal reflux disease)   . Scoliosis     There are no problems to display for this patient.   Past Surgical History:  Procedure Laterality Date  . CESAREAN SECTION       OB History   No obstetric history on file.     No family history on file.  Social History   Tobacco Use  . Smoking status: Current Every Day Smoker    Packs/day: 0.50    Types: Cigarettes    . Smokeless tobacco: Never Used  Substance Use Topics  . Alcohol use: Yes    Comment: daily  . Drug use: Yes    Types: Marijuana    Home Medications Prior to Admission medications   Medication Sig Start Date End Date Taking? Authorizing Provider  oxyCODONE-acetaminophen (PERCOCET/ROXICET) 5-325 MG tablet Take 1 tablet by mouth every 6 (six) hours as needed for severe pain. 03/21/19   Theadore Blunck, Harley Hallmark, PA-C    Allergies    Patient has no known allergies.  Review of Systems   Review of Systems  All other systems are reviewed and are negative for acute change except as noted in the HPI.   Physical Exam Updated Vital Signs BP (!) 121/92 (BP Location: Left Arm)   Pulse (!) 102   Temp 97.7 F (36.5 C) (Oral)   Resp 19   Ht 5\' 2"  (1.575 m)   Wt 52.2 kg   LMP 03/16/2019   SpO2 98%   BMI 21.03 kg/m   Physical Exam Vitals and nursing note reviewed.  Constitutional:      General: She is not in acute distress.    Appearance: She is not ill-appearing.  HENT:     Head: Normocephalic and atraumatic.     Right Ear: Tympanic membrane and external ear normal.     Left Ear: Tympanic membrane and external ear normal.     Nose: Nose  normal.     Mouth/Throat:     Mouth: Mucous membranes are moist.     Pharynx: Oropharynx is clear.  Eyes:     General: No scleral icterus.       Right eye: No discharge.        Left eye: No discharge.     Extraocular Movements: Extraocular movements intact.     Conjunctiva/sclera: Conjunctivae normal.     Pupils: Pupils are equal, round, and reactive to light.  Neck:     Vascular: No JVD.  Cardiovascular:     Rate and Rhythm: Regular rhythm. Tachycardia present.     Pulses: Normal pulses.          Radial pulses are 2+ on the right side and 2+ on the left side.       Dorsalis pedis pulses are 2+ on the right side and 2+ on the left side.     Heart sounds: Normal heart sounds.  Pulmonary:     Comments: Lungs clear to auscultation in all  fields. Symmetric chest rise. No wheezing, rales, or rhonchi. Abdominal:     Tenderness: There is no right CVA tenderness or left CVA tenderness.     Comments: Abdomen is soft, non-distended, and non-tender in all quadrants. No rigidity, no guarding. No peritoneal signs.  Musculoskeletal:     Right shoulder: Normal.     Right upper arm: Normal.     Right elbow: Normal.     Right forearm: Normal.     Right wrist: No swelling, deformity, bony tenderness, snuff box tenderness or crepitus. Decreased range of motion.     Right hand: Normal. No deformity or tenderness. Normal strength. Normal sensation.     Cervical back: Normal range of motion.     Right hip: Normal.     Right upper leg: Normal.     Right knee: Swelling, effusion and bony tenderness present. No deformity, lacerations or crepitus. Decreased range of motion. No tenderness.     Right lower leg: No swelling. No edema.     Left lower leg: No swelling.     Right ankle: Normal.     Right Achilles Tendon: Normal.     Comments: Full range of motion of thoracic and lumbar spine.  Moving extremities without difficulty.  Skin:    General: Skin is warm and dry.     Capillary Refill: Capillary refill takes less than 2 seconds.  Neurological:     Mental Status: She is oriented to person, place, and time.     GCS: GCS eye subscore is 4. GCS verbal subscore is 5. GCS motor subscore is 6.     Comments: Fluent speech, no facial droop.  Psychiatric:        Behavior: Behavior normal.     ED Results / Procedures / Treatments   Labs (all labs ordered are listed, but only abnormal results are displayed) Labs Reviewed - No data to display  EKG None  Radiology DG Wrist Complete Right  Result Date: 03/21/2019 CLINICAL DATA:  Acute RIGHT wrist pain and swelling for 2 days. No known injury. Initial encounter. EXAM: RIGHT WRIST - COMPLETE 3+ VIEW COMPARISON:  None. FINDINGS: No fracture, subluxation or dislocation identified. Bony fusion of  the lunate and triquetrum again noted. No other joint abnormalities are noted. There may be mild soft tissue swelling present. IMPRESSION: Question mild soft tissue swelling without acute bony abnormality. Electronically Signed   By: Harmon Pier M.D.   On: 03/21/2019  15:23   DG Knee Complete 4 Views Right  Result Date: 03/21/2019 CLINICAL DATA:  Acute RIGHT knee pain and swelling for 2 days. No known injury. Initial encounter. EXAM: RIGHT KNEE - COMPLETE 4+ VIEW COMPARISON:  None. FINDINGS: A large knee effusion is present. There is no evidence of fracture, subluxation or dislocation. No other joint abnormalities are noted. No focal bony lesions are present. IMPRESSION: Large knee effusion without bony abnormality. Electronically Signed   By: Harmon Pier M.D.   On: 03/21/2019 15:19    Procedures Procedures (including critical care time)  Medications Ordered in ED Medications  ondansetron (ZOFRAN-ODT) disintegrating tablet 4 mg (4 mg Oral Not Given 03/21/19 1523)  HYDROcodone-acetaminophen (NORCO/VICODIN) 5-325 MG per tablet 1 tablet (1 tablet Oral Given 03/21/19 1524)  dexamethasone (DECADRON) injection 10 mg (10 mg Intramuscular Given 03/21/19 1554)    ED Course  I have reviewed the triage vital signs and the nursing notes.  Pertinent labs & imaging results that were available during my care of the patient were reviewed by me and considered in my medical decision making (see chart for details).   MDM Rules/Calculators/A&P                        Patient seen and examined. Patient presents awake, alert, hemodynamically stable, afebrile, non toxic.  She was noted to be tachycardic to 107 in triage.  She does appear anxious and in pain.  Looking through previous ED visit from the other day on 03/18/2019 she was also noted to be tachycardic at that time suggesting this is her baseline.  Continue to monitor throughout ED visit.  She also had a negative pregnancy test during that visit, will not  repeat today.  On my exam she does have mild swelling to her right knee.  The knee is not erythematous or warm to the touch.  She has decreased range of motion secondary to pain.  Patient given dose of p.o. pain medicine and on reassessment she has full passive range of motion. She has decreased ROM of right wrist as well secondary to pain, exam also improved after pain medicine.  Right upper and lower extremities are neurovascularly intact.  Radial and DP pulses 2+ and strong.  Little to no suspicion for septic joint.  Xray of right knee viewed by me shows large effusion.  X-ray of right wrist also viewed and shows possible soft tissue swelling.  No fractures or dislocations were noted on either films.  Discussed results with patient and engaged in shared decision making.  She is agreeable with plan to follow-up with sports medicine to have possible aspiration and further work-up if needed.  Given she is afebrile and has a reassuring exam feel that she can be discharged home with symptomatic care including therapeutic splints and crutches.  Dose of Decadron given here in the emergency department and will send home with prescription for short course of Percocet. I have reviewed the PDMP during this encounter.  She has no recent narcotic prescriptions.  Given she has two joints swollen there is thought of possibly rheumatoid arthritis.  Discussed with patient and she has no personal or family history of arthritis.  She should follow-up with PCP to have this further evaluated.  Tachycardia improved on recheck.   The patient appears reasonably screened and/or stabilized for discharge and I doubt any other medical condition or other [facility] requiring further screening, evaluation, or treatment in the ED at this time prior  to discharge. The patient is safe for discharge with strict return precautions discussed. Recommend pcp follow up as well as sports medicine.  Patient appears reliable for follow-up   Portions  of this note were generated with Dragon dictation software. Dictation errors may occur despite best attempts at proofreading.    Final Clinical Impression(s) / ED Diagnoses Final diagnoses:  Effusion of right knee  Right wrist pain    Rx / DC Orders ED Discharge Orders         Ordered    oxyCODONE-acetaminophen (PERCOCET/ROXICET) 5-325 MG tablet  Every 6 hours PRN     03/21/19 1548           Sherene Sires, PA-C 03/21/19 1611    Little, Ambrose Finland, MD 03/29/19 [phone]

## 2019-03-21 NOTE — ED Triage Notes (Signed)
R wrist and R knee pain and swelling x 2 days. No known injury.

## 2019-03-21 NOTE — Discharge Instructions (Addendum)
You have been seen today for knee pain and wrist pain. Please read and follow all provided instructions. Return to the emergency room for worsening condition or new concerning symptoms.    The x-ray did not show any broken bones, but your knee has an effusion. -You were given a steroid injection today.  This will help significantly with pain and swelling, it will work over the next few days.  1. Medications:  Prescription sent to your pharmacy for Percocet.  This is a pain medicine.  It can make you drowsy so do not drive or work while taking this.  It has Tylenol in it as he should not take any additional Tylenol at the same time.   -You can also take ibuprofen as needed for pain.  This medicine can be taken at the same time as Percocet, please take as directed on the bottle.  Continue usual home medications Take medications as prescribed. Please review all of the medicines and only take them if you do not have an allergy to them.   2. Treatment: Wear the Ace wrap and splint as needed for comfort.  You can use the crutches to help you get around if needed.  Elevate your knee as much as possible to try to help with swelling.  3. Follow Up:  Please follow up with the sports medicine doctor Dr. Jordan Likes as we discussed..  I have given you his contact information above.  When you call please say "this is an emergency department room follow-up visit."  It is also a possibility that you have an allergic reaction to any of the medicines that you have been prescribed - Everybody reacts differently to medications and while MOST people have no trouble with most medicines, you may have a reaction such as nausea, vomiting, rash, swelling, shortness of breath. If this is the case, please stop taking the medicine immediately and contact your physician.  ?

## 2019-03-22 ENCOUNTER — Other Ambulatory Visit: Payer: Self-pay

## 2019-03-22 ENCOUNTER — Ambulatory Visit: Payer: Self-pay

## 2019-03-22 ENCOUNTER — Ambulatory Visit (INDEPENDENT_AMBULATORY_CARE_PROVIDER_SITE_OTHER): Payer: Self-pay | Admitting: Family Medicine

## 2019-03-22 DIAGNOSIS — M25561 Pain in right knee: Secondary | ICD-10-CM

## 2019-03-22 DIAGNOSIS — M25531 Pain in right wrist: Secondary | ICD-10-CM | POA: Insufficient documentation

## 2019-03-22 DIAGNOSIS — M25461 Effusion, right knee: Secondary | ICD-10-CM | POA: Insufficient documentation

## 2019-03-22 MED ORDER — PREDNISONE 5 MG PO TABS: ORAL_TABLET | ORAL | 0 refills | Status: DC

## 2019-03-22 MED ORDER — PREDNISONE 5 MG PO TABS
ORAL_TABLET | ORAL | 0 refills | Status: DC
Start: 2019-03-22 — End: 2019-03-25

## 2019-03-22 MED ORDER — TRIAMCINOLONE ACETONIDE 40 MG/ML IJ SUSP
40.0000 mg | Freq: Once | INTRAMUSCULAR | Status: AC
  Administered 2019-03-22: 40 mg via INTRA_ARTICULAR

## 2019-03-22 MED FILL — predniSONE 5 MG TABS: 5 | 6 days supply | Qty: 21 | Fill #0

## 2019-03-22 NOTE — Assessment & Plan Note (Signed)
Significant effusion in the knee with no inciting event.  Aspiration was cloudy and likely represents inflammatory change. -Aspiration and injection. -Counseled on supportive care. -Synovial fluid analysis, culture and Gram stain

## 2019-03-22 NOTE — Patient Instructions (Signed)
Nice to meet you Please try compression  Please try ice  I will call with the results   Please send me a message in MyChart with any questions or updates.  Please see me back in 4 weeks .   --Dr. Jordan Likes

## 2019-03-22 NOTE — Progress Notes (Signed)
Natalie Mitchell - 29 y.o. female MRN [id number]  Date of birth: 07/21/1990  SUBJECTIVE:  Including CC & ROS.  No chief complaint on file.   Natalie Mitchell is a 29 y.o. female that is presenting with right knee pain and right wrist pain.  The pain started over the past few days.  She was evaluated in the emergency department.  She denies any specific inciting event or trauma.  She is having redness over the distal ulna.  She denies any family history of similar symptoms or inflammatory conditions.  The pain is constant and severe.  No inciting event or trauma to either the knee or the wrist.  The knee is swollen and has pain with any movement.  Independent review of the right wrist x-ray from 3/14 shows no acute abnormality. Independent review of the right knee x-ray from 3/14 shows a possible effusion.  Review of Systems See HPI   HISTORY: Past Medical, Surgical, Social, and Family History Reviewed & Updated per EMR.   Pertinent Historical Findings include:  Past Medical History:  Diagnosis Date  . Chronic pain   . GERD (gastroesophageal reflux disease)   . Scoliosis     Past Surgical History:  Procedure Laterality Date  . CESAREAN SECTION      No family history on file.  Social History   Socioeconomic History  . Marital status: Single    Spouse name: Not on file  . Number of children: Not on file  . Years of education: Not on file  . Highest education level: Not on file  Occupational History  . Not on file  Tobacco Use  . Smoking status: Current Every Day Smoker    Packs/day: 0.50    Types: Cigarettes  . Smokeless tobacco: Never Used  Substance and Sexual Activity  . Alcohol use: Yes    Comment: daily  . Drug use: Yes    Types: Marijuana  . Sexual activity: Yes    Birth control/protection: Implant  Other Topics Concern  . Not on file  Social History Narrative  . Not on file   Social Determinants of Health   Financial Resource Strain:   . Difficulty of Paying  Living Expenses:   Food Insecurity:   . Worried About Programme researcher, broadcasting/film/video in the Last Year:   . Barista in the Last Year:   Transportation Needs:   . Freight forwarder (Medical):   Marland Kitchen Lack of Transportation (Non-Medical):   Physical Activity:   . Days of Exercise per Week:   . Minutes of Exercise per Session:   Stress:   . Feeling of Stress :   Social Connections:   . Frequency of Communication with Friends and Family:   . Frequency of Social Gatherings with Friends and Family:   . Attends Religious Services:   . Active Member of Clubs or Organizations:   . Attends Banker Meetings:   Marland Kitchen Marital Status:   Intimate Partner Violence:   . Fear of Current or Ex-Partner:   . Emotionally Abused:   Marland Kitchen Physically Abused:   . Sexually Abused:      PHYSICAL EXAM:  VS: LMP 03/16/2019  Physical Exam Gen: NAD, alert, cooperative with exam, well-appearing MSK:  Knee: Obvious effusion. Limited flexion and extension. Generalized tenderness over the anterior knee joint. No instability. Right wrist: Effusion of the dorsal wrist and redness over the distal ulna. Limited flexion extension of the wrist. Normal grip strength. Neurovascularly intact  Limited  ultrasound: Right knee, right wrist:  Right knee: Large effusion in the suprapatellar pouch. Normal-appearing quadricep and patellar tendon. Normal-appearing medial meniscus and joint line. Normal-appearing lateral meniscus and joint line  Right wrist: There appears to be an effusion encompassing the ECU as well as hyperemia in the TFCC.    Summary: Right knee with large effusion.  Right wrist with inflammatory changes in the TFCC and distal ulna  Ultrasound and interpretation by Clearance Coots, MD   Aspiration/Injection Procedure Note Natalie Mitchell 1990-02-11  Procedure: Injection Indications: Right knee pain  Procedure Details Consent: Risks of procedure as well as the alternatives and risks of  each were explained to the (patient/caregiver).  Consent for procedure obtained. Time Out: Verified patient identification, verified procedure, site/side was marked, verified correct patient position, special equipment/implants available, medications/allergies/relevent history reviewed, required imaging and test results available.  Performed.  The area was cleaned with iodine and alcohol swabs.    The right knee superior lateral suprapatellar pouch was injected using 3 cc of 1% lidocaine on a 25-gauge 1-1/2 inch needle to anesthetize the skin in the tract.  An 18-gauge 1-1/2 inch needle was inserted to achieve aspiration.  The syringe was switched and a mixture containing 1 cc's of 40 mg Kenalog and 4 cc's of 0.25% bupivacaine was injected.  Ultrasound was used. Images were obtained in long views showing the injection.    Amount of Fluid Aspirated: 44mL Character of Fluid: purulent appearing and cloudy Fluid was sent GNF:AOZH count, culture and gram stain   A sterile dressing was applied.  Patient did tolerate procedure well.    ASSESSMENT & PLAN:   Knee effusion, right Significant effusion in the knee with no inciting event.  Aspiration was cloudy and likely represents inflammatory change. -Aspiration and injection. -Counseled on supportive care. -Synovial fluid analysis, culture and Gram stain  Right wrist pain There appears to be inflammatory change in the TFCC.  This does not appear to be related to gout.   -Prednisone. -Counseled on supportive care. -May need to obtain rheumatologic panel

## 2019-03-22 NOTE — Assessment & Plan Note (Signed)
There appears to be inflammatory change in the TFCC.  This does not appear to be related to gout.   -Prednisone. -Counseled on supportive care. -May need to obtain rheumatologic panel

## 2019-03-23 LAB — SYNOVIAL FLUID, CELL COUNT
Nuc cell # Fld: 70290 cells/uL — ABNORMAL HIGH (ref 0–200)
RBC, Fluid: 26000 /uL

## 2019-03-23 LAB — GRAM STAIN: Organism ID, Bacteria: NONE SEEN

## 2019-03-24 ENCOUNTER — Telehealth: Payer: Self-pay | Admitting: Family Medicine

## 2019-03-24 NOTE — Telephone Encounter (Signed)
Informed of results. Doesn't appear to be infections. Will likely need a rheum panel going forward.   Myra Rude, MD [facility] 03/24/2019, 8:27 AM

## 2019-03-25 ENCOUNTER — Telehealth: Payer: Self-pay | Admitting: Family Medicine

## 2019-03-25 ENCOUNTER — Encounter: Payer: Self-pay | Admitting: Family Medicine

## 2019-03-25 ENCOUNTER — Ambulatory Visit (INDEPENDENT_AMBULATORY_CARE_PROVIDER_SITE_OTHER): Payer: Self-pay | Admitting: Family Medicine

## 2019-03-25 ENCOUNTER — Other Ambulatory Visit: Payer: Self-pay

## 2019-03-25 ENCOUNTER — Other Ambulatory Visit: Payer: Self-pay | Admitting: Family Medicine

## 2019-03-25 ENCOUNTER — Ambulatory Visit: Payer: Self-pay

## 2019-03-25 VITALS — BP 115/85 | Temp 98.4°F | Ht 64.0 in | Wt 110.0 lb

## 2019-03-25 DIAGNOSIS — M25412 Effusion, left shoulder: Secondary | ICD-10-CM | POA: Insufficient documentation

## 2019-03-25 DIAGNOSIS — M25522 Pain in left elbow: Secondary | ICD-10-CM

## 2019-03-25 DIAGNOSIS — M25422 Effusion, left elbow: Secondary | ICD-10-CM

## 2019-03-25 MED ORDER — PREDNISONE 20 MG PO TABS: ORAL_TABLET | ORAL | 0 refills | Status: AC

## 2019-03-25 MED FILL — predniSONE 20 MG TABS: 20 | 10 days supply | Qty: 18 | Fill #0

## 2019-03-25 NOTE — Assessment & Plan Note (Signed)
Symptoms seem to have a similar origin as her elbow, right wrist and right knee. -Prednisone

## 2019-03-25 NOTE — Telephone Encounter (Signed)
Patient calling regarding her left arm. She said she received an IV at the emergency room on 03/21/2019 and is concerned about nerve damage. She states she can't bend her arm or get any sleep due to the pain.

## 2019-03-25 NOTE — Progress Notes (Signed)
Natalie Mitchell - 29 y.o. female MRN [id number]  Date of birth: 1990-03-03  SUBJECTIVE:  Including CC & ROS.  Chief Complaint  Patient presents with  . Arm Pain    left arm    Natalie Mitchell is a 29 y.o. female that is presenting with acute left elbow and shoulder pain.  The pain is moderate to severe.  Is becoming constant.  She is unable to flex her elbow without significant pain.  She is unable to raise her shoulder above her head without significant pain.  She was initially having some chills a few days ago.  She denies any fevers today.  She does not have any significant warmth over the joints.  Has been taking prednisone and has had improvement of her wrist and her knee but her left side has become more problematic.  Independent review of the synovial analysis shows a significant neutrophil count but no crystals.   Review of Systems See HPI   HISTORY: Past Medical, Surgical, Social, and Family History Reviewed & Updated per EMR.   Pertinent Historical Findings include:  Past Medical History:  Diagnosis Date  . Chronic pain   . GERD (gastroesophageal reflux disease)   . Scoliosis     Past Surgical History:  Procedure Laterality Date  . CESAREAN SECTION      No family history on file.  Social History   Socioeconomic History  . Marital status: Single    Spouse name: Not on file  . Number of children: Not on file  . Years of education: Not on file  . Highest education level: Not on file  Occupational History  . Not on file  Tobacco Use  . Smoking status: Current Every Day Smoker    Packs/day: 0.50    Types: Cigarettes  . Smokeless tobacco: Never Used  Substance and Sexual Activity  . Alcohol use: Yes    Comment: daily  . Drug use: Yes    Types: Marijuana  . Sexual activity: Yes    Birth control/protection: Implant  Other Topics Concern  . Not on file  Social History Narrative  . Not on file   Social Determinants of Health   Financial Resource Strain:   .  Difficulty of Paying Living Expenses:   Food Insecurity:   . Worried About Charity fundraiser in the Last Year:   . Arboriculturist in the Last Year:   Transportation Needs:   . Film/video editor (Medical):   Marland Kitchen Lack of Transportation (Non-Medical):   Physical Activity:   . Days of Exercise per Week:   . Minutes of Exercise per Session:   Stress:   . Feeling of Stress :   Social Connections:   . Frequency of Communication with Friends and Family:   . Frequency of Social Gatherings with Friends and Family:   . Attends Religious Services:   . Active Member of Clubs or Organizations:   . Attends Archivist Meetings:   Marland Kitchen Marital Status:   Intimate Partner Violence:   . Fear of Current or Ex-Partner:   . Emotionally Abused:   Marland Kitchen Physically Abused:   . Sexually Abused:      PHYSICAL EXAM:  VS: BP 115/85   Temp 98.4 F (36.9 C) (Oral)   Ht 5\' 4"  (1.626 m)   Wt 110 lb (49.9 kg)   LMP 03/16/2019   BMI 18.88 kg/m  Physical Exam Gen: NAD, alert, cooperative with exam, well-appearing MSK:  Left shoulder: No  obvious effusion. Significant pain with abduction or external rotation. Passively can go into flexion abduction. Left elbow: Limited extension and flexion. Tenderness palpation of the joint itself. Pain with resistance to pronation supination. Neurovascularly intact  Limited ultrasound: Left elbow, left shoulder:  Left elbow: Moderate effusion noted within the joint space. No changes over the lateral condyle. Normal insertion of the triceps into the olecranon.  Left shoulder: Mild effusion noted in the posterior glenohumeral joint  Summary: Patient suggest effusions in the shoulder and elbow  Ultrasound and interpretation by Clare Gandy, MD   Aspiration/Injection Procedure Note Mccall Will [birth year]  Procedure: Aspiration Indications: Left elbow pain  Procedure Details Consent: Risks of procedure as well as the alternatives and risks of  each were explained to the (patient/caregiver).  Consent for procedure obtained. Time Out: Verified patient identification, verified procedure, site/side was marked, verified correct patient position, special equipment/implants available, medications/allergies/relevent history reviewed, required imaging and test results available.  Performed.  The area was cleaned with iodine and alcohol swabs.    The left posterior elbow joint was injected using 4 cc's of 1 % lidocaine with a 25 1 1/2" needle.  An 18-gauge 1-1/2 inch needle was inserted for aspiration.  Ultrasound was used. Images were obtained in short views showing the injection.    Amount of Fluid Aspirated: 16mL Character of Fluid: cloudy Fluid was sent OTL:[id number]  A sterile dressing was applied.  Patient did tolerate procedure well.    ASSESSMENT & PLAN:   Effusion of elbow joint, left Acutely occurring with significant pain and loss of range of motion.  Seems less likely for infectious.  Seems to be acutely occurring with her right wrist pain and right knee pain.  Synovial analysis showed significant neutrophil count but the Gram stain was negative for infection.  She is currently on prednisone with limited improvement. -Aspiration. -Sent for culture. -Uric acid, ANA panel, CRP, sed rate, HIV, blood culture, CBC, CMP -Discontinue the current prednisone and provided an increased burst. -Counseled on need for immediate care and follow-up.  Effusion of joint of left shoulder Symptoms seem to have a similar origin as her elbow, right wrist and right knee. -Prednisone

## 2019-03-25 NOTE — Telephone Encounter (Signed)
Patient seen in clinic today.   Myra Rude, MD [facility] 03/25/2019, 1:34 PM

## 2019-03-25 NOTE — Assessment & Plan Note (Signed)
Acutely occurring with significant pain and loss of range of motion.  Seems less likely for infectious.  Seems to be acutely occurring with her right wrist pain and right knee pain.  Synovial analysis showed significant neutrophil count but the Gram stain was negative for infection.  She is currently on prednisone with limited improvement. -Aspiration. -Sent for culture. -Uric acid, ANA panel, CRP, sed rate, HIV, blood culture, CBC, CMP -Discontinue the current prednisone and provided an increased burst. -Counseled on need for immediate care and follow-up.

## 2019-03-25 NOTE — Patient Instructions (Signed)
Good to see you Please stop the current prednisone prescription and start the one I sent in today  I will call you with the blood work that is completed today   Please send me a message in MyChart with any questions or updates.  Follow up will depend on the blood work.   --Dr. Jordan Likes

## 2019-03-26 ENCOUNTER — Telehealth: Payer: Self-pay | Admitting: Family Medicine

## 2019-03-26 NOTE — Telephone Encounter (Signed)
    Patient calling for lab results 

## 2019-03-26 NOTE — Telephone Encounter (Signed)
Spoke with patient about results. She will try prednisone again today and monitor. If no better will go to emergency room.   Myra Rude, MD [facility] 03/26/2019, 8:29 AM

## 2019-03-27 LAB — CBC WITH DIFFERENTIAL
Basophils Absolute: 0 10*3/uL (ref 0.0–0.2)
Basos: 0 %
EOS (ABSOLUTE): 0.1 10*3/uL (ref 0.0–0.4)
Eos: 1 %
Hematocrit: 35.3 % (ref 34.0–46.6)
Hemoglobin: 11.8 g/dL (ref 11.1–15.9)
Immature Grans (Abs): 0.2 10*3/uL — ABNORMAL HIGH (ref 0.0–0.1)
Immature Granulocytes: 1 %
Lymphocytes Absolute: 2.6 10*3/uL (ref 0.7–3.1)
Lymphs: 18 %
MCH: 27.4 pg (ref 26.6–33.0)
MCHC: 33.4 g/dL (ref 31.5–35.7)
MCV: 82 fL (ref 79–97)
Monocytes Absolute: 0.5 10*3/uL (ref 0.1–0.9)
Monocytes: 3 %
Neutrophils Absolute: 11.2 10*3/uL — ABNORMAL HIGH (ref 1.4–7.0)
Neutrophils: 77 %
RBC: 4.31 x10E6/uL (ref 3.77–5.28)
RDW: 13.1 % (ref 11.7–15.4)
WBC: 14.6 10*3/uL — ABNORMAL HIGH (ref 3.4–10.8)

## 2019-03-27 LAB — COMPREHENSIVE METABOLIC PANEL
ALT: 95 IU/L — ABNORMAL HIGH (ref 0–32)
AST: 62 IU/L — ABNORMAL HIGH (ref 0–40)
Albumin/Globulin Ratio: 1.1 — ABNORMAL LOW (ref 1.2–2.2)
Albumin: 3.9 g/dL (ref 3.9–5.0)
Alkaline Phosphatase: 83 IU/L (ref 39–117)
BUN/Creatinine Ratio: 14 (ref 9–23)
BUN: 9 mg/dL (ref 6–20)
Bilirubin Total: 0.2 mg/dL (ref 0.0–1.2)
CO2: 22 mmol/L (ref 20–29)
Calcium: 9.2 mg/dL (ref 8.7–10.2)
Chloride: 96 mmol/L (ref 96–106)
Creatinine, Ser: 0.63 mg/dL (ref 0.57–1.00)
GFR calc Af Amer: 141 mL/min/{1.73_m2} (ref >59)
GFR calc non Af Amer: 122 mL/min/{1.73_m2} (ref >59)
Globulin, Total: 3.5 g/dL (ref 1.5–4.5)
Glucose: 109 mg/dL — ABNORMAL HIGH (ref 65–99)
Potassium: 3 mmol/L — ABNORMAL LOW (ref 3.5–5.2)
Sodium: 136 mmol/L (ref 134–144)
Total Protein: 7.4 g/dL (ref 6.0–8.5)

## 2019-03-27 LAB — ANA,IFA RA DIAG PNL W/RFLX TIT/PATN
ANA Titer 1: NEGATIVE
Cyclic Citrullin Peptide Ab: 5 units (ref 0–19)
Rhuematoid fact SerPl-aCnc: <10 IU/mL (ref 0.0–13.9)

## 2019-03-27 LAB — HIV ANTIBODY (ROUTINE TESTING W REFLEX): HIV Screen 4th Generation wRfx: NONREACTIVE

## 2019-03-27 LAB — URIC ACID: Uric Acid: 3.5 mg/dL (ref 2.6–6.2)

## 2019-03-27 LAB — SEDIMENTATION RATE: Sed Rate: 82 mm/hr — ABNORMAL HIGH (ref 0–32)

## 2019-03-27 LAB — C-REACTIVE PROTEIN: CRP: 167 mg/L — ABNORMAL HIGH (ref 0–10)

## 2019-03-29 ENCOUNTER — Telehealth: Payer: Self-pay | Admitting: Family Medicine

## 2019-03-29 LAB — BODY FLUID CULTURE

## 2019-03-29 MED ORDER — BISACODYL 5 MG PO TBEC
10.00 | DELAYED_RELEASE_TABLET | ORAL | Status: DC
Start: ? — End: 2019-03-29

## 2019-03-29 MED ORDER — SODIUM CHLORIDE 0.9 % IV SOLN
INTRAVENOUS | Status: DC
Start: ? — End: 2019-03-29

## 2019-03-29 MED ORDER — ALUM & MAG HYDROXIDE-SIMETH 200-200-20 MG/5ML PO SUSP
30.00 | ORAL | Status: DC
Start: ? — End: 2019-03-29

## 2019-03-29 MED ORDER — ONDANSETRON HCL 4 MG/2ML IJ SOLN
4.00 | INTRAMUSCULAR | Status: DC
Start: ? — End: 2019-03-29

## 2019-03-29 MED ORDER — GENERIC EXTERNAL MEDICATION
4.50 | Status: DC
Start: 2019-03-29 — End: 2019-03-29

## 2019-03-29 MED ORDER — DEXTROMETHORPHAN-GUAIFENESIN 10-100 MG/5ML PO LIQD
5.00 | ORAL | Status: DC
Start: ? — End: 2019-03-29

## 2019-03-29 MED ORDER — GENERIC EXTERNAL MEDICATION
750.00 | Status: DC
Start: 2019-03-29 — End: 2019-03-29

## 2019-03-29 MED ORDER — HYDROXYZINE HCL 25 MG PO TABS
25.00 | ORAL_TABLET | ORAL | Status: DC
Start: ? — End: 2019-03-29

## 2019-03-29 MED ORDER — FLUCONAZOLE 200 MG PO TABS
200.00 | ORAL_TABLET | ORAL | Status: DC
Start: 2019-04-01 — End: 2019-03-29

## 2019-03-29 MED ORDER — HYDRALAZINE HCL 10 MG PO TABS
10.00 | ORAL_TABLET | ORAL | Status: DC
Start: ? — End: 2019-03-29

## 2019-03-29 MED ORDER — ACETAMINOPHEN 325 MG PO TABS
650.00 | ORAL_TABLET | ORAL | Status: DC
Start: ? — End: 2019-03-29

## 2019-03-29 MED ORDER — MAGNESIUM SULFATE 2 GM/50ML IV SOLN
2.00 | INTRAVENOUS | Status: DC
Start: 2019-03-29 — End: 2019-03-29

## 2019-03-29 MED ORDER — ENOXAPARIN SODIUM 40 MG/0.4ML ~~LOC~~ SOLN
40.00 | SUBCUTANEOUS | Status: DC
Start: 2019-03-31 — End: 2019-03-29

## 2019-03-29 MED ORDER — MELATONIN 3 MG PO TABS
3.00 | ORAL_TABLET | ORAL | Status: DC
Start: ? — End: 2019-03-29

## 2019-03-29 NOTE — Telephone Encounter (Signed)
Unable to leave VM for patient. If she calls back please have her speak with a nurse/CMA and inform that her blood culture returned positive. It appears upon chart review she is currently admitted.   If any questions then please take the best time and phone number to call and I will try to call her back.   Myra Rude, MD [facility] 03/29/2019, 8:27 AM

## 2019-03-31 LAB — CULTURE, BLOOD (SINGLE)

## 2019-03-31 MED ORDER — GENERIC EXTERNAL MEDICATION
2.00 | Status: DC
Start: 2019-04-01 — End: 2019-03-31

## 2019-03-31 MED ORDER — METRONIDAZOLE 500 MG PO TABS
500.00 | ORAL_TABLET | ORAL | Status: DC
Start: 2019-03-31 — End: 2019-03-31

## 2019-04-19 ENCOUNTER — Other Ambulatory Visit: Payer: Self-pay

## 2019-04-19 ENCOUNTER — Encounter: Payer: Self-pay | Admitting: Family Medicine

## 2019-04-19 ENCOUNTER — Ambulatory Visit (INDEPENDENT_AMBULATORY_CARE_PROVIDER_SITE_OTHER): Payer: Self-pay | Admitting: Family Medicine

## 2019-04-19 DIAGNOSIS — M25531 Pain in right wrist: Secondary | ICD-10-CM

## 2019-04-19 DIAGNOSIS — M25461 Effusion, right knee: Secondary | ICD-10-CM

## 2019-04-19 NOTE — Assessment & Plan Note (Signed)
Has a mild effusion today.  No pain in the knee however.  Has improved with her antibiotic regimen. -Counseled on supportive care. -Provided work note. -Could aspirate if effusion is ongoing.

## 2019-04-19 NOTE — Patient Instructions (Signed)
Good to see you Please try ice as needed  Please try compression  Please try the exercises   Please send me a message in MyChart with any questions or updates.  Please see [place] back as needed.   --Dr. Jordan Likes

## 2019-04-19 NOTE — Progress Notes (Signed)
Natalie Mitchell - 29 y.o. female MRN [id number]  Date of birth: 1990/12/15  SUBJECTIVE:  Including CC & ROS.  Chief Complaint  Patient presents with  . Follow-up    follow up for right knee and right wrist    Natalie Mitchell is a 29 y.o. female that is following up for her right wrist and knee pain.  Analysis was done on the knee which did not show any infection.  She was admitted recently for disseminated gonococcal infection.  She has just finished her outpatient IV antibiotics.  The swelling is still occurring in the knee but her pain is improved.  Still having slight pain in the wrist as well.  Pain is not near what it was previously.   Review of Systems See HPI   HISTORY: Past Medical, Surgical, Social, and Family History Reviewed & Updated per EMR.   Pertinent Historical Findings include:  Past Medical History:  Diagnosis Date  . Chronic pain   . GERD (gastroesophageal reflux disease)   . Scoliosis     Past Surgical History:  Procedure Laterality Date  . CESAREAN SECTION      No family history on file.  Social History   Socioeconomic History  . Marital status: Single    Spouse name: Not on file  . Number of children: Not on file  . Years of education: Not on file  . Highest education level: Not on file  Occupational History  . Not on file  Tobacco Use  . Smoking status: Current Every Day Smoker    Packs/day: 0.50    Types: Cigarettes  . Smokeless tobacco: Never Used  Substance and Sexual Activity  . Alcohol use: Yes    Comment: daily  . Drug use: Yes    Types: Marijuana  . Sexual activity: Yes    Birth control/protection: Implant  Other Topics Concern  . Not on file  Social History Narrative  . Not on file   Social Determinants of Health   Financial Resource Strain:   . Difficulty of Paying Living Expenses:   Food Insecurity:   . Worried About Programme researcher, broadcasting/film/video in the Last Year:   . Barista in the Last Year:   Transportation Needs:   . Sales promotion account executive (Medical):   Marland Kitchen Lack of Transportation (Non-Medical):   Physical Activity:   . Days of Exercise per Week:   . Minutes of Exercise per Session:   Stress:   . Feeling of Stress :   Social Connections:   . Frequency of Communication with Friends and Family:   . Frequency of Social Gatherings with Friends and Family:   . Attends Religious Services:   . Active Member of Clubs or Organizations:   . Attends Banker Meetings:   Marland Kitchen Marital Status:   Intimate Partner Violence:   . Fear of Current or Ex-Partner:   . Emotionally Abused:   Marland Kitchen Physically Abused:   . Sexually Abused:      PHYSICAL EXAM:  VS: BP (!) 133/97   Pulse 82   Ht 5\' 3"  (1.6 m)   Wt 110 lb (49.9 kg)   BMI 19.49 kg/m  Physical Exam Gen: NAD, alert, cooperative with exam, well-appearing MSK:  Right knee: Mild effusion. Normal range of motion. Normal strength resistance. Right wrist. Normal range of motion. Normal grip strength. Neurovascularly intact     ASSESSMENT & PLAN:   Right wrist pain Likely reactive to the disseminated gonococcal infection. -Counseled  on supportive care. -Could consider imaging or injection if needed.  Knee effusion, right Has a mild effusion today.  No pain in the knee however.  Has improved with her antibiotic regimen. -Counseled on supportive care. -Provided work note. -Could aspirate if effusion is ongoing.

## 2019-04-19 NOTE — Assessment & Plan Note (Signed)
Likely reactive to the disseminated gonococcal infection. -Counseled on supportive care. -Could consider imaging or injection if needed.

## 2021-06-29 IMAGING — DX DG WRIST COMPLETE 3+V*R*
4 series · 4 of 4 positions shown · non-contrast
Comparison: None.

CLINICAL DATA: Acute RIGHT wrist pain and swelling for 2 days. No
known injury. Initial encounter.

EXAM:
RIGHT WRIST - COMPLETE 3+ VIEW

[wrist ap]
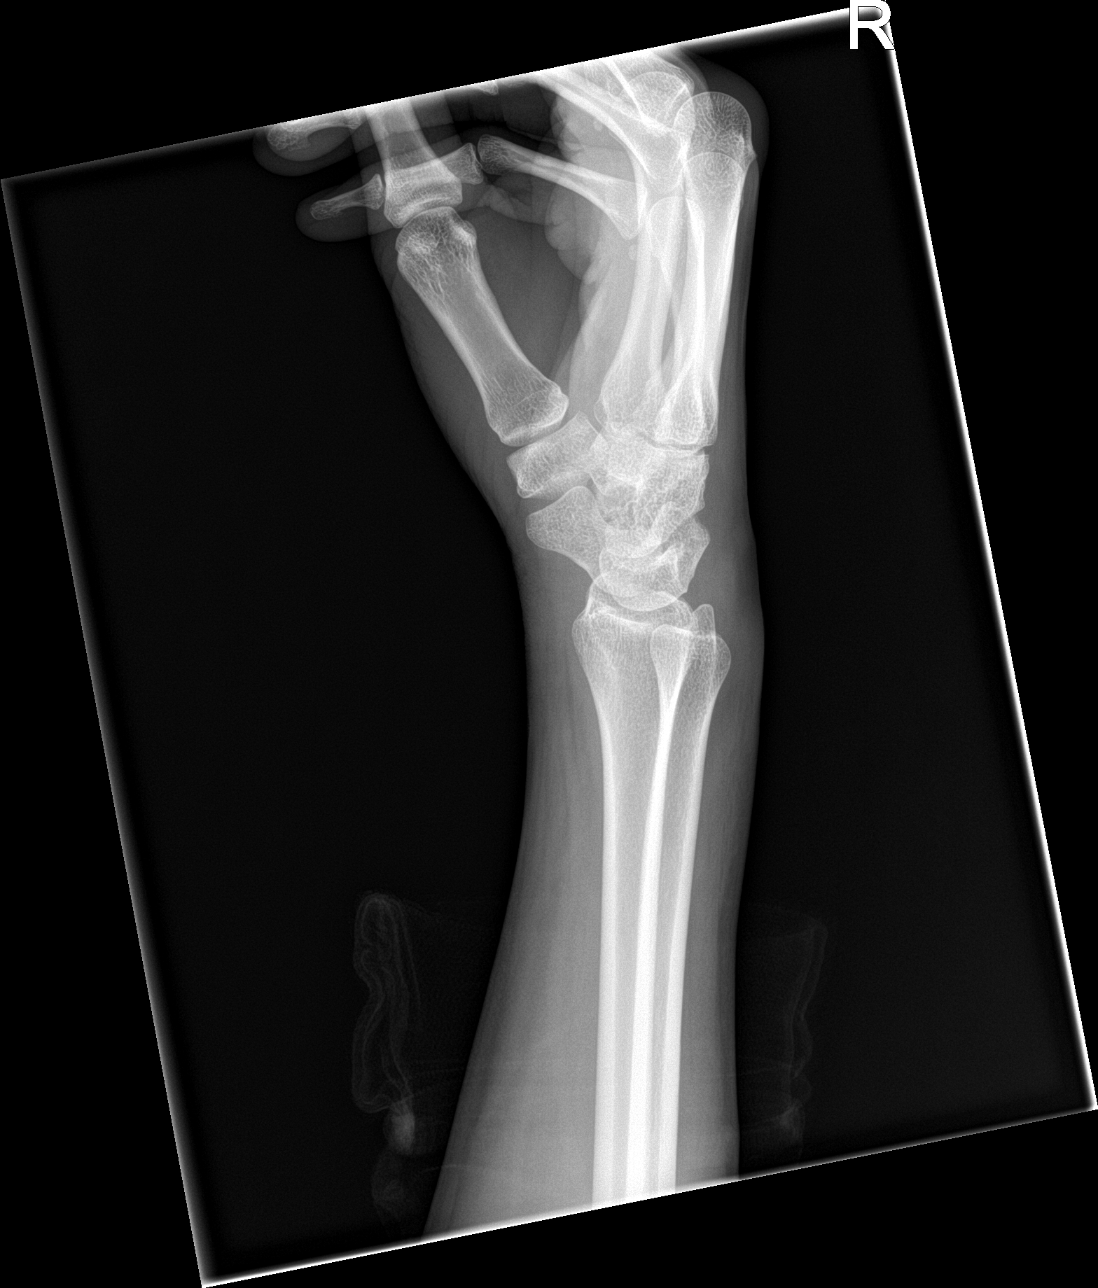

[wrist obl]
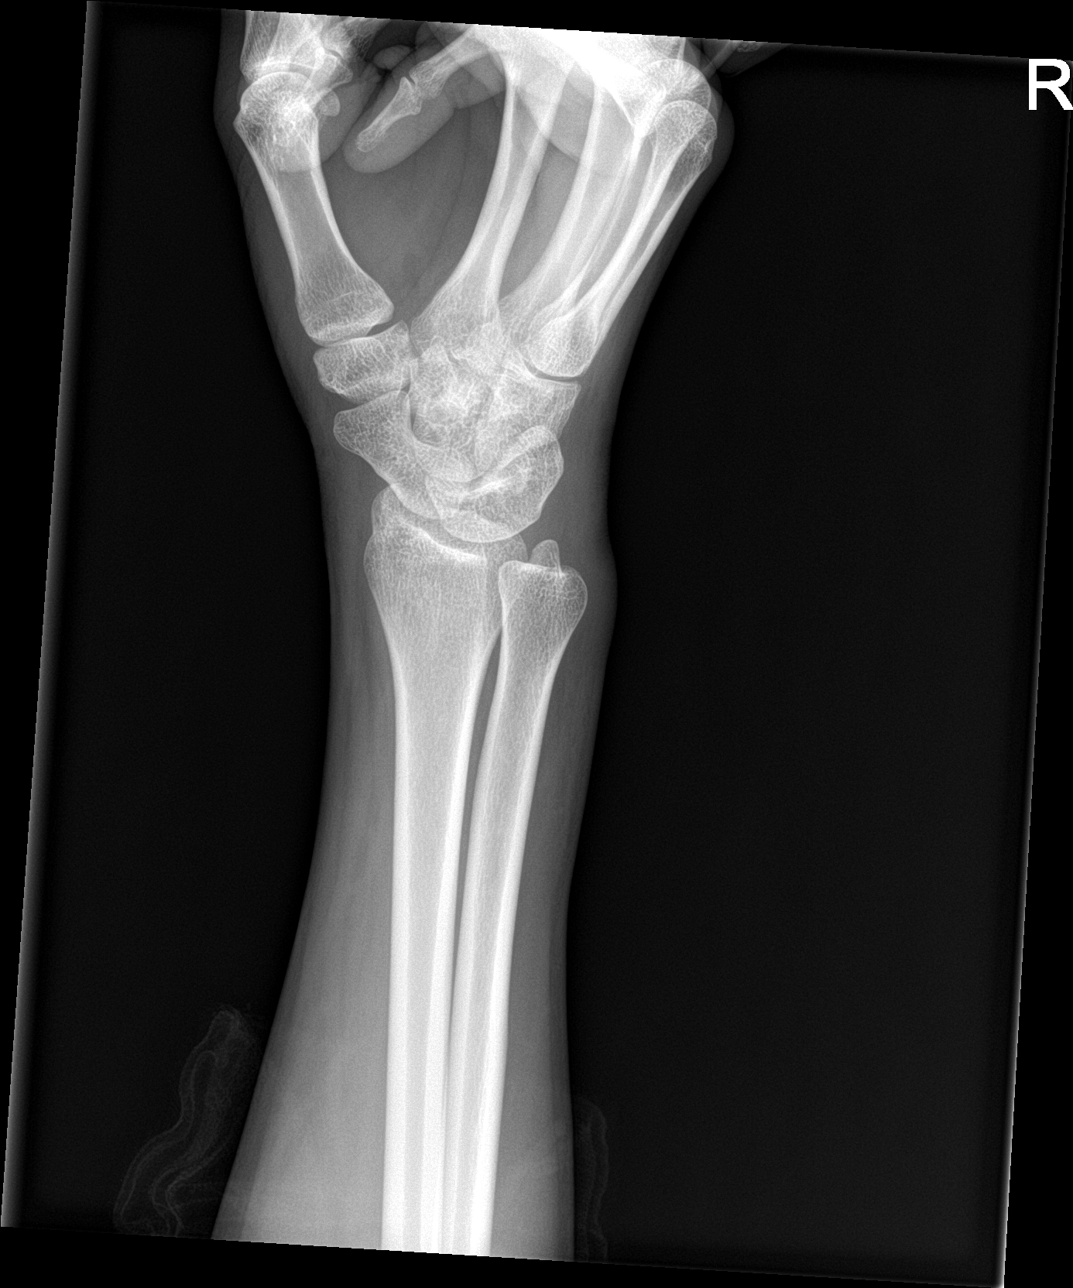

[wrist lat]
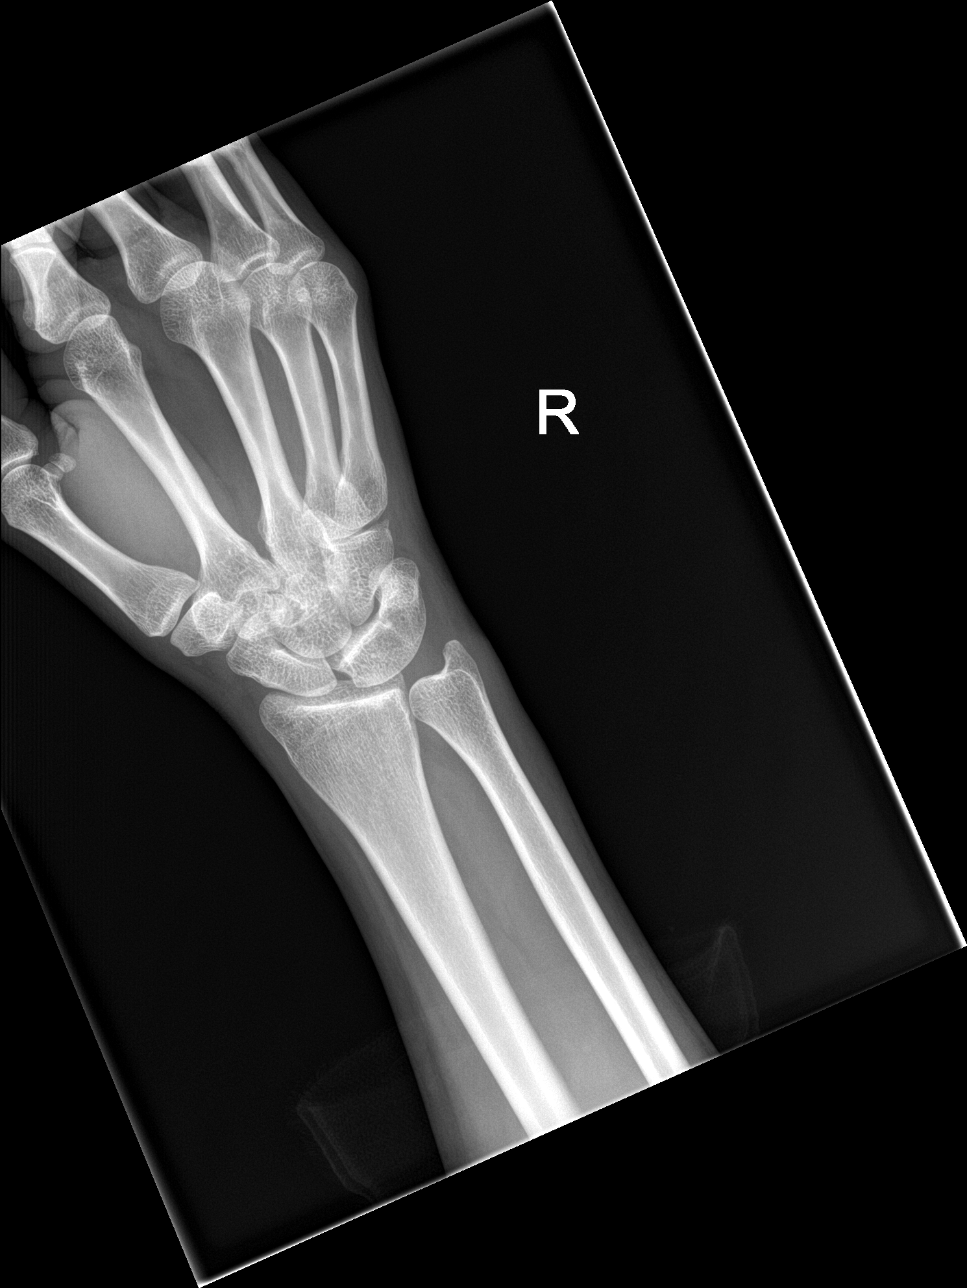

[knee lat]
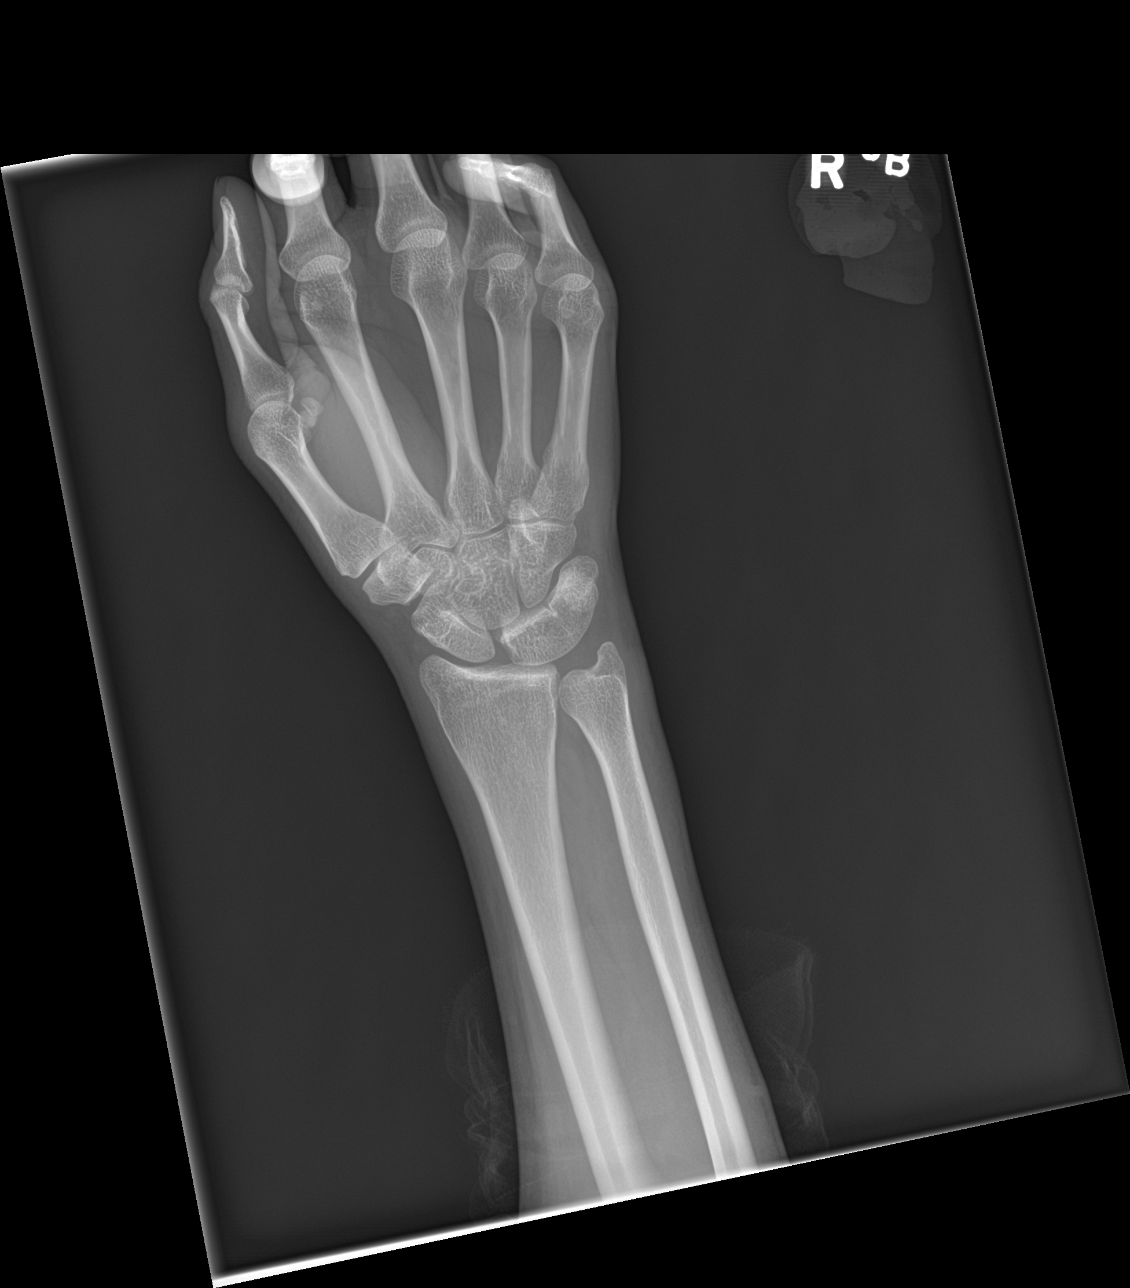

[4 of 4 positions shown; findings below may reference images not displayed]

FINDINGS: No fracture, subluxation or dislocation identified.

Bony fusion of the lunate and triquetrum again noted.

No other joint abnormalities are noted.

There may be mild soft tissue swelling present.
IMPRESSION: Question mild soft tissue swelling without acute bony abnormality.

## 2022-04-24 ENCOUNTER — Encounter: Payer: Self-pay | Admitting: *Deleted
